# Patient Record
Sex: Female | Born: 1960 | Race: White | Hispanic: No | Marital: Single | Smoking: Never smoker
Health system: Southern US, Community
[De-identification: ages and names within clinical notes are randomized; demographics above are authoritative.]

## PROBLEM LIST (undated history)

## (undated) DIAGNOSIS — G43909 Migraine, unspecified, not intractable, without status migrainosus: Secondary | ICD-10-CM

## (undated) HISTORY — PX: CHOLECYSTECTOMY: SHX55

## (undated) HISTORY — PX: HAND SURGERY: SHX662

## (undated) HISTORY — PX: DILATION AND CURETTAGE OF UTERUS: SHX78

## (undated) HISTORY — PX: KNEE ARTHROSCOPY: SUR90

## (undated) HISTORY — DX: Migraine, unspecified, not intractable, without status migrainosus: G43.909

## (undated) HISTORY — PX: TUBAL LIGATION: SHX77

---

## 2000-12-11 ENCOUNTER — Encounter: Payer: Self-pay | Admitting: Emergency Medicine

## 2000-12-11 ENCOUNTER — Emergency Department (HOSPITAL_COMMUNITY): Admission: EM | Admit: 2000-12-11 | Discharge: 2000-12-12 | Payer: Self-pay | Admitting: Emergency Medicine

## 2003-07-01 ENCOUNTER — Encounter: Payer: Self-pay | Admitting: Preventative Medicine

## 2003-07-01 ENCOUNTER — Ambulatory Visit (HOSPITAL_COMMUNITY): Admission: RE | Admit: 2003-07-01 | Discharge: 2003-07-01 | Payer: Self-pay | Admitting: Preventative Medicine

## 2005-05-04 ENCOUNTER — Ambulatory Visit (HOSPITAL_COMMUNITY): Admission: RE | Admit: 2005-05-04 | Discharge: 2005-05-04 | Payer: Self-pay | Admitting: Neurology

## 2005-07-01 ENCOUNTER — Ambulatory Visit (HOSPITAL_COMMUNITY): Admission: RE | Admit: 2005-07-01 | Discharge: 2005-07-01 | Payer: Self-pay | Admitting: General Surgery

## 2005-07-05 ENCOUNTER — Ambulatory Visit (HOSPITAL_COMMUNITY): Admission: RE | Admit: 2005-07-05 | Discharge: 2005-07-05 | Payer: Self-pay | Admitting: General Surgery

## 2005-10-21 ENCOUNTER — Ambulatory Visit (HOSPITAL_COMMUNITY): Admission: RE | Admit: 2005-10-21 | Discharge: 2005-10-21 | Payer: Self-pay | Admitting: Preventative Medicine

## 2006-12-05 ENCOUNTER — Ambulatory Visit: Payer: Self-pay | Admitting: Family Medicine

## 2006-12-05 DIAGNOSIS — K219 Gastro-esophageal reflux disease without esophagitis: Secondary | ICD-10-CM | POA: Insufficient documentation

## 2006-12-05 DIAGNOSIS — J309 Allergic rhinitis, unspecified: Secondary | ICD-10-CM | POA: Insufficient documentation

## 2006-12-05 DIAGNOSIS — N318 Other neuromuscular dysfunction of bladder: Secondary | ICD-10-CM | POA: Insufficient documentation

## 2006-12-05 DIAGNOSIS — G43009 Migraine without aura, not intractable, without status migrainosus: Secondary | ICD-10-CM | POA: Insufficient documentation

## 2006-12-05 DIAGNOSIS — M129 Arthropathy, unspecified: Secondary | ICD-10-CM | POA: Insufficient documentation

## 2006-12-05 DIAGNOSIS — F411 Generalized anxiety disorder: Secondary | ICD-10-CM | POA: Insufficient documentation

## 2006-12-05 DIAGNOSIS — F329 Major depressive disorder, single episode, unspecified: Secondary | ICD-10-CM | POA: Insufficient documentation

## 2006-12-05 DIAGNOSIS — F17200 Nicotine dependence, unspecified, uncomplicated: Secondary | ICD-10-CM | POA: Insufficient documentation

## 2006-12-05 DIAGNOSIS — J45909 Unspecified asthma, uncomplicated: Secondary | ICD-10-CM | POA: Insufficient documentation

## 2006-12-18 ENCOUNTER — Encounter (INDEPENDENT_AMBULATORY_CARE_PROVIDER_SITE_OTHER): Payer: Self-pay | Admitting: Family Medicine

## 2006-12-19 ENCOUNTER — Ambulatory Visit: Payer: Self-pay | Admitting: Family Medicine

## 2006-12-20 LAB — CONVERTED CEMR LAB
ALT: 16 units/L (ref 0–35)
AST: 20 units/L (ref 0–37)
Albumin: 4.6 g/dL (ref 3.5–5.2)
Alkaline Phosphatase: 70 units/L (ref 39–117)
BUN: 15 mg/dL (ref 6–23)
Basophils Absolute: 0.1 10*3/uL (ref 0.0–0.1)
Basophils Relative: 1 % (ref 0–1)
CO2: 21 meq/L (ref 19–32)
Calcium: 9.8 mg/dL (ref 8.4–10.5)
Chloride: 105 meq/L (ref 96–112)
Creatinine, Ser: 0.68 mg/dL (ref 0.40–1.20)
Eosinophils Absolute: 0.1 10*3/uL (ref 0.0–0.7)
Eosinophils Relative: 1 % (ref 0–5)
Glucose, Bld: 81 mg/dL (ref 70–99)
HCT: 44 % (ref 36.0–46.0)
Hemoglobin: 14.4 g/dL (ref 12.0–15.0)
Lymphocytes Relative: 24 % (ref 12–46)
Lymphs Abs: 2.5 10*3/uL (ref 0.7–3.3)
MCHC: 32.7 g/dL (ref 30.0–36.0)
MCV: 93.2 fL (ref 78.0–100.0)
Monocytes Absolute: 0.8 10*3/uL — ABNORMAL HIGH (ref 0.2–0.7)
Monocytes Relative: 7 % (ref 3–11)
Neutro Abs: 7.1 10*3/uL (ref 1.7–7.7)
Neutrophils Relative %: 68 % (ref 43–77)
Platelets: 281 10*3/uL (ref 150–400)
Potassium: 4.9 meq/L (ref 3.5–5.3)
RBC: 4.72 M/uL (ref 3.87–5.11)
RDW: 12.5 % (ref 11.5–14.0)
Sodium: 141 meq/L (ref 135–145)
TSH: 2.035 microintl units/mL (ref 0.350–5.50)
Total Bilirubin: 0.4 mg/dL (ref 0.3–1.2)
Total Protein: 7.8 g/dL (ref 6.0–8.3)
WBC: 10.6 10*3/uL — ABNORMAL HIGH (ref 4.0–10.5)

## 2007-01-16 ENCOUNTER — Ambulatory Visit: Payer: Self-pay | Admitting: Family Medicine

## 2007-01-16 DIAGNOSIS — G4700 Insomnia, unspecified: Secondary | ICD-10-CM | POA: Insufficient documentation

## 2007-01-30 ENCOUNTER — Ambulatory Visit (HOSPITAL_COMMUNITY): Payer: Self-pay | Admitting: Psychiatry

## 2007-02-05 ENCOUNTER — Ambulatory Visit (HOSPITAL_COMMUNITY): Payer: Self-pay | Admitting: Psychiatry

## 2007-02-16 ENCOUNTER — Ambulatory Visit (HOSPITAL_COMMUNITY): Payer: Self-pay | Admitting: Psychiatry

## 2007-02-21 ENCOUNTER — Ambulatory Visit (HOSPITAL_COMMUNITY): Payer: Self-pay | Admitting: Psychiatry

## 2007-02-27 ENCOUNTER — Ambulatory Visit: Payer: Self-pay | Admitting: Family Medicine

## 2007-03-01 ENCOUNTER — Ambulatory Visit (HOSPITAL_COMMUNITY): Payer: Self-pay | Admitting: Psychiatry

## 2007-03-01 ENCOUNTER — Encounter (INDEPENDENT_AMBULATORY_CARE_PROVIDER_SITE_OTHER): Payer: Self-pay | Admitting: Family Medicine

## 2007-03-06 ENCOUNTER — Ambulatory Visit: Payer: Self-pay | Admitting: Family Medicine

## 2007-03-06 ENCOUNTER — Emergency Department (HOSPITAL_COMMUNITY): Admission: EM | Admit: 2007-03-06 | Discharge: 2007-03-06 | Payer: Self-pay | Admitting: Emergency Medicine

## 2007-03-06 LAB — CONVERTED CEMR LAB
Hemoglobin: 12.2 g/dL
OCCULT 1: NEGATIVE

## 2007-03-07 ENCOUNTER — Ambulatory Visit (HOSPITAL_COMMUNITY): Payer: Self-pay | Admitting: Psychiatry

## 2007-03-07 ENCOUNTER — Telehealth (INDEPENDENT_AMBULATORY_CARE_PROVIDER_SITE_OTHER): Payer: Self-pay | Admitting: *Deleted

## 2007-03-08 ENCOUNTER — Encounter (INDEPENDENT_AMBULATORY_CARE_PROVIDER_SITE_OTHER): Payer: Self-pay | Admitting: Family Medicine

## 2007-03-13 ENCOUNTER — Ambulatory Visit (HOSPITAL_COMMUNITY): Payer: Self-pay | Admitting: Psychiatry

## 2007-03-23 ENCOUNTER — Ambulatory Visit: Payer: Self-pay | Admitting: Family Medicine

## 2007-03-27 ENCOUNTER — Ambulatory Visit: Payer: Self-pay | Admitting: Family Medicine

## 2007-04-30 ENCOUNTER — Encounter (INDEPENDENT_AMBULATORY_CARE_PROVIDER_SITE_OTHER): Payer: Self-pay | Admitting: Family Medicine

## 2007-05-07 ENCOUNTER — Telehealth (INDEPENDENT_AMBULATORY_CARE_PROVIDER_SITE_OTHER): Payer: Self-pay | Admitting: Family Medicine

## 2007-05-07 ENCOUNTER — Telehealth (INDEPENDENT_AMBULATORY_CARE_PROVIDER_SITE_OTHER): Payer: Self-pay | Admitting: *Deleted

## 2007-09-06 ENCOUNTER — Encounter (INDEPENDENT_AMBULATORY_CARE_PROVIDER_SITE_OTHER): Payer: Self-pay | Admitting: Family Medicine

## 2007-10-19 ENCOUNTER — Encounter (INDEPENDENT_AMBULATORY_CARE_PROVIDER_SITE_OTHER): Payer: Self-pay | Admitting: Family Medicine

## 2007-11-12 ENCOUNTER — Ambulatory Visit (HOSPITAL_COMMUNITY): Admission: RE | Admit: 2007-11-12 | Discharge: 2007-11-12 | Payer: Self-pay | Admitting: Family Medicine

## 2007-11-12 ENCOUNTER — Ambulatory Visit: Payer: Self-pay | Admitting: Family Medicine

## 2007-11-12 DIAGNOSIS — M542 Cervicalgia: Secondary | ICD-10-CM | POA: Insufficient documentation

## 2007-11-13 ENCOUNTER — Telehealth (INDEPENDENT_AMBULATORY_CARE_PROVIDER_SITE_OTHER): Payer: Self-pay | Admitting: *Deleted

## 2007-11-13 ENCOUNTER — Ambulatory Visit: Payer: Self-pay | Admitting: Family Medicine

## 2007-11-13 ENCOUNTER — Encounter (INDEPENDENT_AMBULATORY_CARE_PROVIDER_SITE_OTHER): Payer: Self-pay | Admitting: Family Medicine

## 2007-11-13 ENCOUNTER — Other Ambulatory Visit: Admission: RE | Admit: 2007-11-13 | Discharge: 2007-11-13 | Payer: Self-pay | Admitting: Family Medicine

## 2007-11-13 DIAGNOSIS — E785 Hyperlipidemia, unspecified: Secondary | ICD-10-CM | POA: Insufficient documentation

## 2007-11-13 DIAGNOSIS — N6459 Other signs and symptoms in breast: Secondary | ICD-10-CM | POA: Insufficient documentation

## 2007-11-13 LAB — CONVERTED CEMR LAB: Pap Smear: NORMAL

## 2007-11-14 ENCOUNTER — Encounter (INDEPENDENT_AMBULATORY_CARE_PROVIDER_SITE_OTHER): Payer: Self-pay | Admitting: Family Medicine

## 2007-11-14 ENCOUNTER — Telehealth (INDEPENDENT_AMBULATORY_CARE_PROVIDER_SITE_OTHER): Payer: Self-pay | Admitting: *Deleted

## 2007-11-14 LAB — CONVERTED CEMR LAB
FSH: 35.3 milliintl units/mL
LH: 41.7 milliintl units/mL
Prolactin: 9.1 ng/mL

## 2007-11-15 ENCOUNTER — Telehealth (INDEPENDENT_AMBULATORY_CARE_PROVIDER_SITE_OTHER): Payer: Self-pay | Admitting: *Deleted

## 2007-11-15 ENCOUNTER — Encounter (INDEPENDENT_AMBULATORY_CARE_PROVIDER_SITE_OTHER): Payer: Self-pay | Admitting: Family Medicine

## 2007-11-20 ENCOUNTER — Telehealth (INDEPENDENT_AMBULATORY_CARE_PROVIDER_SITE_OTHER): Payer: Self-pay | Admitting: Family Medicine

## 2007-11-21 ENCOUNTER — Ambulatory Visit (HOSPITAL_COMMUNITY): Admission: RE | Admit: 2007-11-21 | Discharge: 2007-11-21 | Payer: Self-pay | Admitting: Family Medicine

## 2007-11-21 ENCOUNTER — Encounter (INDEPENDENT_AMBULATORY_CARE_PROVIDER_SITE_OTHER): Payer: Self-pay | Admitting: Family Medicine

## 2007-11-22 ENCOUNTER — Telehealth (INDEPENDENT_AMBULATORY_CARE_PROVIDER_SITE_OTHER): Payer: Self-pay | Admitting: *Deleted

## 2007-11-23 ENCOUNTER — Telehealth (INDEPENDENT_AMBULATORY_CARE_PROVIDER_SITE_OTHER): Payer: Self-pay | Admitting: *Deleted

## 2007-11-27 ENCOUNTER — Ambulatory Visit: Payer: Self-pay | Admitting: Family Medicine

## 2008-01-29 ENCOUNTER — Ambulatory Visit: Payer: Self-pay | Admitting: Family Medicine

## 2008-09-13 ENCOUNTER — Encounter (INDEPENDENT_AMBULATORY_CARE_PROVIDER_SITE_OTHER): Payer: Self-pay | Admitting: Family Medicine

## 2008-09-17 ENCOUNTER — Ambulatory Visit: Payer: Self-pay | Admitting: Family Medicine

## 2008-09-17 DIAGNOSIS — H8110 Benign paroxysmal vertigo, unspecified ear: Secondary | ICD-10-CM | POA: Insufficient documentation

## 2008-09-18 ENCOUNTER — Telehealth (INDEPENDENT_AMBULATORY_CARE_PROVIDER_SITE_OTHER): Payer: Self-pay | Admitting: Family Medicine

## 2008-12-17 ENCOUNTER — Ambulatory Visit (HOSPITAL_COMMUNITY): Admission: RE | Admit: 2008-12-17 | Discharge: 2008-12-17 | Payer: Self-pay | Admitting: Family Medicine

## 2008-12-17 ENCOUNTER — Ambulatory Visit: Payer: Self-pay | Admitting: Family Medicine

## 2008-12-17 DIAGNOSIS — R079 Chest pain, unspecified: Secondary | ICD-10-CM | POA: Insufficient documentation

## 2009-01-28 ENCOUNTER — Ambulatory Visit (HOSPITAL_COMMUNITY): Admission: RE | Admit: 2009-01-28 | Discharge: 2009-01-28 | Payer: Self-pay | Admitting: Family Medicine

## 2009-01-28 ENCOUNTER — Ambulatory Visit: Payer: Self-pay | Admitting: Family Medicine

## 2009-01-28 DIAGNOSIS — R51 Headache: Secondary | ICD-10-CM | POA: Insufficient documentation

## 2009-01-28 DIAGNOSIS — R519 Headache, unspecified: Secondary | ICD-10-CM | POA: Insufficient documentation

## 2009-01-28 LAB — CONVERTED CEMR LAB
Bilirubin Urine: NEGATIVE
Blood in Urine, dipstick: NEGATIVE
Glucose, Urine, Semiquant: NEGATIVE
Ketones, urine, test strip: NEGATIVE
Nitrite: NEGATIVE
Protein, U semiquant: NEGATIVE
Specific Gravity, Urine: 1.01
Urobilinogen, UA: 0.2
WBC Urine, dipstick: NEGATIVE
pH: 6.5

## 2009-01-29 ENCOUNTER — Encounter (INDEPENDENT_AMBULATORY_CARE_PROVIDER_SITE_OTHER): Payer: Self-pay | Admitting: Family Medicine

## 2009-01-29 LAB — CONVERTED CEMR LAB
ALT: 16 units/L (ref 0–35)
AST: 17 units/L (ref 0–37)
Albumin: 4.4 g/dL (ref 3.5–5.2)
Alkaline Phosphatase: 80 units/L (ref 39–117)
BUN: 13 mg/dL (ref 6–23)
Basophils Absolute: 0 10*3/uL (ref 0.0–0.1)
Basophils Relative: 0 % (ref 0–1)
CO2: 23 meq/L (ref 19–32)
Calcium: 9.7 mg/dL (ref 8.4–10.5)
Chloride: 104 meq/L (ref 96–112)
Cholesterol: 223 mg/dL — ABNORMAL HIGH (ref 0–200)
Creatinine, Ser: 0.79 mg/dL (ref 0.40–1.20)
Eosinophils Absolute: 0.1 10*3/uL (ref 0.0–0.7)
Eosinophils Relative: 1 % (ref 0–5)
Glucose, Bld: 102 mg/dL — ABNORMAL HIGH (ref 70–99)
HCT: 43.4 % (ref 36.0–46.0)
HDL: 67 mg/dL (ref >39)
Hemoglobin: 14.6 g/dL (ref 12.0–15.0)
LDL Cholesterol: 135 mg/dL — ABNORMAL HIGH (ref 0–99)
Lymphocytes Relative: 27 % (ref 12–46)
Lymphs Abs: 2.2 10*3/uL (ref 0.7–4.0)
MCHC: 33.6 g/dL (ref 30.0–36.0)
MCV: 90.8 fL (ref 78.0–100.0)
Monocytes Absolute: 0.7 10*3/uL (ref 0.1–1.0)
Monocytes Relative: 9 % (ref 3–12)
Neutro Abs: 5.2 10*3/uL (ref 1.7–7.7)
Neutrophils Relative %: 63 % (ref 43–77)
Platelets: 231 10*3/uL (ref 150–400)
Potassium: 4.9 meq/L (ref 3.5–5.3)
RBC: 4.78 M/uL (ref 3.87–5.11)
RDW: 13.1 % (ref 11.5–15.5)
Sodium: 140 meq/L (ref 135–145)
TSH: 2.681 microintl units/mL (ref 0.350–4.500)
Total Bilirubin: 0.4 mg/dL (ref 0.3–1.2)
Total CHOL/HDL Ratio: 3.3
Total Protein: 7.7 g/dL (ref 6.0–8.3)
Triglycerides: 107 mg/dL (ref <150)
VLDL: 21 mg/dL (ref 0–40)
WBC: 8.2 10*3/uL (ref 4.0–10.5)

## 2009-02-11 ENCOUNTER — Other Ambulatory Visit: Admission: RE | Admit: 2009-02-11 | Discharge: 2009-02-11 | Payer: Self-pay | Admitting: Family Medicine

## 2009-02-11 ENCOUNTER — Ambulatory Visit: Payer: Self-pay | Admitting: Family Medicine

## 2009-02-11 ENCOUNTER — Encounter (INDEPENDENT_AMBULATORY_CARE_PROVIDER_SITE_OTHER): Payer: Self-pay | Admitting: Family Medicine

## 2009-02-11 DIAGNOSIS — K620 Anal polyp: Secondary | ICD-10-CM | POA: Insufficient documentation

## 2009-02-11 DIAGNOSIS — K621 Rectal polyp: Secondary | ICD-10-CM

## 2009-02-16 ENCOUNTER — Encounter (INDEPENDENT_AMBULATORY_CARE_PROVIDER_SITE_OTHER): Payer: Self-pay | Admitting: *Deleted

## 2009-02-18 ENCOUNTER — Ambulatory Visit (HOSPITAL_COMMUNITY): Admission: RE | Admit: 2009-02-18 | Discharge: 2009-02-18 | Payer: Self-pay | Admitting: Family Medicine

## 2009-02-19 ENCOUNTER — Encounter (INDEPENDENT_AMBULATORY_CARE_PROVIDER_SITE_OTHER): Payer: Self-pay | Admitting: *Deleted

## 2009-02-20 ENCOUNTER — Encounter (INDEPENDENT_AMBULATORY_CARE_PROVIDER_SITE_OTHER): Payer: Self-pay | Admitting: *Deleted

## 2009-03-02 ENCOUNTER — Encounter (INDEPENDENT_AMBULATORY_CARE_PROVIDER_SITE_OTHER): Payer: Self-pay | Admitting: Family Medicine

## 2009-03-02 ENCOUNTER — Ambulatory Visit (HOSPITAL_COMMUNITY): Admission: RE | Admit: 2009-03-02 | Discharge: 2009-03-02 | Payer: Self-pay | Admitting: General Surgery

## 2009-03-11 ENCOUNTER — Ambulatory Visit: Payer: Self-pay | Admitting: Family Medicine

## 2010-11-14 LAB — CONVERTED CEMR LAB
ALT: 19 units/L (ref 0–35)
AST: 23 units/L (ref 0–37)
Albumin: 4.5 g/dL (ref 3.5–5.2)
Alkaline Phosphatase: 64 units/L (ref 39–117)
BUN: 13 mg/dL (ref 6–23)
Basophils Absolute: 0.1 10*3/uL (ref 0.0–0.1)
Basophils Relative: 1 % (ref 0–1)
Bilirubin Urine: NEGATIVE
CO2: 23 meq/L (ref 19–32)
Calcium: 9.6 mg/dL (ref 8.4–10.5)
Chloride: 104 meq/L (ref 96–112)
Cholesterol, target level: 200 mg/dL
Cholesterol: 230 mg/dL — ABNORMAL HIGH (ref 0–200)
Creatinine, Ser: 0.79 mg/dL (ref 0.40–1.20)
Eosinophils Absolute: 0.1 10*3/uL (ref 0.0–0.7)
Eosinophils Relative: 2 % (ref 0–5)
Glucose, Bld: 111 mg/dL — ABNORMAL HIGH (ref 70–99)
Glucose, Urine, Semiquant: NEGATIVE
HCT: 45.3 % (ref 36.0–46.0)
HDL goal, serum: 40 mg/dL
HDL: 69 mg/dL (ref >39)
Hemoglobin: 14.9 g/dL (ref 12.0–15.0)
LDL Cholesterol: 135 mg/dL — ABNORMAL HIGH (ref 0–99)
LDL Goal: 160 mg/dL
Lymphocytes Relative: 29 % (ref 12–46)
Lymphs Abs: 2.4 10*3/uL (ref 0.7–4.0)
MCHC: 32.9 g/dL (ref 30.0–36.0)
MCV: 91.7 fL (ref 78.0–100.0)
Monocytes Absolute: 0.6 10*3/uL (ref 0.1–1.0)
Monocytes Relative: 7 % (ref 3–12)
Neutro Abs: 5.2 10*3/uL (ref 1.7–7.7)
Neutrophils Relative %: 62 % (ref 43–77)
Nitrite: NEGATIVE
Platelets: 232 10*3/uL (ref 150–400)
Potassium: 5 meq/L (ref 3.5–5.3)
RBC: 4.94 M/uL (ref 3.87–5.11)
RDW: 13 % (ref 11.5–15.5)
Sodium: 139 meq/L (ref 135–145)
Specific Gravity, Urine: 1.02
TSH: 3.349 microintl units/mL (ref 0.350–5.50)
Total Bilirubin: 0.5 mg/dL (ref 0.3–1.2)
Total CHOL/HDL Ratio: 3.3
Total Protein: 7.8 g/dL (ref 6.0–8.3)
Triglycerides: 128 mg/dL (ref <150)
Urobilinogen, UA: 0.2
VLDL: 26 mg/dL (ref 0–40)
WBC Urine, dipstick: NEGATIVE
WBC: 8.4 10*3/uL (ref 4.0–10.5)
pH: 5.5

## 2010-11-16 NOTE — Medication Information (Signed)
Summary: Tax adviser   Imported By: Donneta Romberg 09/06/2007 11:35:23  _____________________________________________________________________  External Attachment:    Type:   Image     Comment:   External Document
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
## 2010-11-16 NOTE — Assessment & Plan Note (Signed)
Summary: Rash/arc   Vital Signs:  Patient Profile:   50 Years Old Female Height:     67 inches Weight:      154 pounds BMI:     24.21 O2 Sat:      100 % Pulse rate:   93 / minute Resp:     14 per minute BP sitting:   124 / 75  Vitals Entered By: Sherilyn Banker (January 29, 2008 9:57 AM)                 PCP:  Franchot Heidelberg, MD  Chief Complaint:  rash on abdomen and legs  x 2 weeks / needs med refills.  History of Present Illness: Pt in for acute visit.  She has had a rash for two weeks now. States very itchy. Started under left breast area and has spread to flannks. She self treated with Benadryl and hand sanitizer and nothing helps.  She has tried topical hydrocrtisone and this made it quite a bit better. She states she has not had fever, chills or sweats. Admits feels hot though. Sx worse at night with regard to itch. She has not been in woods. Denies new pets and adds had pitbull come uop in yard one week ago. She has not used new meds. She has not used new body products but adds was down in [place] 3 weeks ago and spent time with son and rash started a week or so after.   She now presents.    Prior Medications Reviewed Using: Medication Bottles  Updated Prior Medication List: TRAMADOL HCL 50 MG TABS (TRAMADOL HCL) 1-2 tabs q8hrs IMITREX 50 MG TABS (SUMATRIPTAN SUCCINATE) 1 tab q2hrs as needed MECLIZINE HCL 25 MG TABS (MECLIZINE HCL) q8hrs INDERAL LA 80 MG CP24 (PROPRANOLOL HCL) once daily DETROL LA 4 MG CP24 (TOLTERODINE TARTRATE) once daily PROAIR HFA  AERS (ALBUTEROL SULFATE AERS) 2 puffs every 6 hours as needed. VITAMIN B-12 100 MCG TABS (CYANOCOBALAMIN) One by mouth daily ZOLOFT 100 MG TABS (SERTRALINE HCL) 1/2 by mouth daily SINGULAIR 10 MG TABS (MONTELUKAST SODIUM) One daily HYDROXYZINE HCL 50 MG TABS (HYDROXYZINE HCL) One by mouth every 6 hours as needed. PRILOSEC 20 MG CPDR (OMEPRAZOLE) One daily FLEXERIL 10 MG  TABS (CYCLOBENZAPRINE HCL) three times a  day  Current Allergies (reviewed today): ! PENICILLIN ! CEPHALOSPORINS ! * BETALACTAMS ! * CARBAPENEM ! * PENICILLAMINE ! * SALICYLATES ! * PYRAZOLE ANTIPYRECTICS ! NSAIDS ! * SULFINPYRAZONE ! * OPIOD ANALGESICS ! MORPHINE ! CODEINE  Past Medical History:    Reviewed history from 12/05/2006 and no changes required:       Current Problems:        HYPERLIPIDEMIA (ICD-272.4)       NIPPLE DISCHARGE (ICD-611.79)       SPECIAL SCREENING FOR OSTEOPOROSIS (ICD-V82.81)       SPECIAL SCREENING FOR MALIGNANT NEOPLASMS COLON (ICD-V76.51)       OTHER SCREENING BREAST EXAMINATION (ICD-V76.19)       NECK PAIN (ICD-723.1)       WELL ADULT EXAM (ICD-V70.0)       BACK PAIN, ACUTE (ICD-724.5)       CHEST PAIN (ICD-786.50)       ASTHMA (ICD-493.90)       INSOMNIA (ICD-780.52)       TOBACCO USER (ICD-305.1)       ARTHRITIS (ICD-716.90)       COMMON MIGRAINE (ICD-346.10)       OVERACTIVE BLADDER (ICD-596.51)  ASTHMA (ICD-493.90)       GERD (ICD-530.81)       DEPRESSION (ICD-311)       ANXIETY (ICD-300.00)       ALLERGIC RHINITIS (ICD-477.9)         Past Surgical History:    Reviewed history from 12/05/2006 and no changes required:       Cholecystectomy       Tubal ligation       prolapsed colon       breast cyst       R hand       L leg       4 D & C   Family History:    Reviewed history from 12/05/2006 and no changes required:       father- stomach ca/ htn/ dm/ chf       mother-colon ca/ allergies       grandmother- kidney failure       3 brothers & 1 sister-htn/ dm       2 children- allergies  Social History:    Reviewed history from 12/05/2006 and no changes required:       Married       lives with spouse and children       Current Smoker       Alcohol use-yes       Drug use-no   Risk Factors:  Tobacco use:  current    Year started:  2003    Cigarettes:  Yes -- 1/2 pack(s) per day    Counseled to quit/cut down tobacco use:  yes Drug use:  no Alcohol use:   yes    Type:  Shots of Vodka    Drinks per day:  3  Family History Risk Factors:    Family History of MI in females < 31 years old:  no    Family History of MI in males < 57 years old:  no  Mammogram History:    Date of Last Mammogram:  11/21/2007  PAP Smear History:    Date of Last PAP Smear:  11/13/2007   Review of Systems      See HPI   Physical Exam  General:     Well-developed,well-nourished,in no acute distress; alert,appropriate and cooperative throughout examination Lungs:     Normal respiratory effort, chest expands symmetrically. Lungs are clear to auscultation, no crackles or wheezes. Heart:     Normal rate and regular rhythm. S1 and S2 normal without gallop, murmur, click, rub or other extra sounds. Abdomen:     Bowel sounds positive,abdomen soft and non-tender without masses, organomegaly or hernias noted. Extremities:     No clubbing, cyanosis, edema, or deformity noted with normal full range of motion of all joints.   Skin:     Smal;l red viscular rash left and right flank. Lesions few mm in size, slightly raised. No dranage or cellulitus. Psych:     Cognition and judgment appear intact. Alert and cooperative with normal attention span and concentration. No apparent delusions, illusions, hallucinations    Impression & Recommendations:  Problem # 1:  SKIN RASH (ICD-782.1) Discussed. Single dose Decadron/Depomedrol. Apply topical Beandryl cream. Councelled need to do oat meal baths and will rfill hydrozxyzine for itch. Update if sx perist and refer Derm. Orders: Decadron 1mg  (Z3664) Depo- Medrol 80mg  (J1040) Admin of Therapeutic Inj  intramuscular or subcutaneous (40347)   Problem # 2:  TOBACCO USER (ICD-305.1) Could not afford Chantix. Not open to Rx at present.  Follow. Aware of risk and benefit. The following medications were removed from the medication list:    Chantix 0.5 Mg Tabs (Varenicline tartrate) ..... One daily   Problem # 3:   Dispostion Pt has to go down to [place] to care for grandkids next 6 months while son off to [place]. Scripts refilled for 3 months and advised to call if more needed. Will tentatively plan to see back in 3 months unless absolutely unable to do so.  Complete Medication List: 1)  Tramadol Hcl 50 Mg Tabs (Tramadol hcl) .Marland Kitchen.. 1-2 tabs q8hrs 2)  Imitrex 50 Mg Tabs (Sumatriptan succinate) .Marland Kitchen.. 1 tab q2hrs as needed 3)  Meclizine Hcl 25 Mg Tabs (Meclizine hcl) .... Q8hrs 4)  Inderal La 80 Mg Cp24 (Propranolol hcl) .... Once daily 5)  Detrol La 4 Mg Cp24 (Tolterodine tartrate) .... Once daily 6)  Proair Hfa Aers (Albuterol sulfate aers) .... 2 puffs every 6 hours as needed. 7)  Vitamin B-12 100 Mcg Tabs (Cyanocobalamin) .... One by mouth daily 8)  Zoloft 100 Mg Tabs (Sertraline hcl) .... 1/2 by mouth daily 9)  Singulair 10 Mg Tabs (Montelukast sodium) .... One daily 10)  Hydroxyzine Hcl 50 Mg Tabs (Hydroxyzine hcl) .... One by mouth every 6 hours as needed. 11)  Prilosec 20 Mg Cpdr (Omeprazole) .... One daily 12)  Flexeril 10 Mg Tabs (Cyclobenzaprine hcl) .... Three times a day   Patient Instructions: 1)  Please schedule a follow-up appointment in 3 months.    Prescriptions: PRILOSEC 20 MG CPDR (OMEPRAZOLE) One daily  #30 x 3   Entered and Authorized by:   Franchot Heidelberg MD   Signed by:   Franchot Heidelberg MD on 01/29/2008   Method used:   Print then Give to Patient   RxID:   [id number] HYDROXYZINE HCL 50 MG TABS (HYDROXYZINE HCL) One by mouth every 6 hours as needed.  #60 x 0   Entered and Authorized by:   Franchot Heidelberg MD   Signed by:   Franchot Heidelberg MD on 01/29/2008   Method used:   Print then Give to Patient   RxID:   [id number] SINGULAIR 10 MG TABS (MONTELUKAST SODIUM) One daily  #30 x 3   Entered and Authorized by:   Franchot Heidelberg MD   Signed by:   Franchot Heidelberg MD on 01/29/2008   Method used:   Print then Give to Patient   RxID:    [id number] ZOLOFT 100 MG TABS (SERTRALINE HCL) 1/2 by mouth daily  #15 x 3   Entered and Authorized by:   Franchot Heidelberg MD   Signed by:   Franchot Heidelberg MD on 01/29/2008   Method used:   Print then Give to Patient   RxID:   [id number] PROAIR HFA  AERS (ALBUTEROL SULFATE AERS) 2 puffs every 6 hours as needed.  #1 x 3   Entered and Authorized by:   Franchot Heidelberg MD   Signed by:   Franchot Heidelberg MD on 01/29/2008   Method used:   Print then Give to Patient   RxID:   [id number] DETROL LA 4 MG CP24 (TOLTERODINE TARTRATE) once daily  #30 x 3   Entered and Authorized by:   Franchot Heidelberg MD   Signed by:   Franchot Heidelberg MD on 01/29/2008   Method used:   Print then Give to Patient   RxID:   [id number] INDERAL LA 80 MG CP24 (PROPRANOLOL HCL) once daily  #30 x 3   Entered and  Authorized by:   Franchot Heidelberg MD   Signed by:   Franchot Heidelberg MD on 01/29/2008   Method used:   Print then Give to Patient   RxID:   [id number] MECLIZINE HCL 25 MG TABS (MECLIZINE HCL) q8hrs  #30 x 3   Entered and Authorized by:   Franchot Heidelberg MD   Signed by:   Franchot Heidelberg MD on 01/29/2008   Method used:   Print then Give to Patient   RxID:   [id number]  ]  Medication Administration  Injection # 1:    Medication: Decadron 1mg     Diagnosis: SKIN RASH (ICD-782.1)    Route: IM    Site: L thigh    Exp Date: 10/17/2009    Lot #: [id number]    Mfr: [employer]    Patient tolerated injection without complications    Given by: Sherilyn Banker (January 29, 2008 11:05 AM)  Injection # 2:    Medication: Depo- Medrol 80mg     Diagnosis: SKIN RASH (ICD-782.1)    Route: IM    Site: L thigh    Exp Date: 02/14/2009    Mfr: sicor    Patient tolerated injection without complications    Given by: Sherilyn Banker (January 29, 2008 11:06 AM)  Orders Added: 1)  Est. Patient Level III [84696] 2)  Decadron 1mg  [J1094] 3)  Depo- Medrol 80mg  [J1040] 4)   Admin of Therapeutic Inj  intramuscular or subcutaneous [29528]

## 2010-11-16 NOTE — Assessment & Plan Note (Signed)
Summary: 1 month follow up/arc   Vital Signs:  Patient Profile:   50 Years Old Female Height:     67 inches Weight:      150 pounds BMI:     23.58 O2 Sat:      100 % Temp:     98.1 degrees F Pulse rate:   100 / minute Resp:     16 per minute BP sitting:   135 / 66  Vitals Entered By: Sherilyn Banker (January 16, 2007 3:16 PM)               Visit Type:  Recheck PCP:  Franchot Heidelberg, MD  Chief Complaint:  follow up visit.  History of Present Illness: Pt in today for follow-up.  She notes she is doing fair. She is on Zoloft for depression. She states this works well and she feels a little better. She has a lot of stress in caring for her mother-in-law and she notes her husband and her are trying to work out things given his cheating and sleeping around.   She is still drinking and states weekends are worse. She drank four drinks off double shots on Friday and did the same on Sunday. She was dry the rest of the week. She states she drinks beacause she needs to chill. She wants Xanax. She was on this in the past. She has been referred to Psych but did not go as she did not want to fill out her paperwork to see.  She is less irritable than before. She is concentrating so-so. Her apetite is fair. She states support at home is aweful but husband and "nana" are more supportive. No suicdal ideation.   She just bwants something to help her sleep.  Now presents.   Prior Medications (reviewed today): TRAMADOL HCL 50 MG TABS (TRAMADOL HCL) 1-2 tabs q8hrs IMITREX 50 MG TABS (SUMATRIPTAN SUCCINATE) 1 tab q2hrs as needed MECLIZINE HCL 25 MG TABS (MECLIZINE HCL) q8hrs INDERAL LA 80 MG CP24 (PROPRANOLOL HCL) once daily DETROL LA 4 MG CP24 (TOLTERODINE TARTRATE) once daily PROAIR HFA  AERS (ALBUTEROL SULFATE AERS) 2 puffs every 6 hours as needed. VITAMIN B-12 100 MCG TABS (CYANOCOBALAMIN) One by mouth daily ZOLOFT 100 MG TABS (SERTRALINE HCL) One by mouth daily PROMETHAZINE HCL 25 MG TABS  (PROMETHAZINE HCL) One by mouth every 6 hours as needed. Current Allergies (reviewed today): No known allergies      Review of Systems      See HPI   Physical Exam  General:     Well-developed,well-nourished,in no acute distress; alert,appropriate and cooperative throughout examination Lungs:     Normal respiratory effort, chest expands symmetrically. Lungs are clear to auscultation, no crackles or wheezes. Heart:     Normal rate and regular rhythm. S1 and S2 normal without gallop, murmur, click, rub or other extra sounds. Abdomen:     Bowel sounds positive,abdomen soft and non-tender without masses, organomegaly or hernias noted. Extremities:     No clubbing, cyanosis, edema, or deformity noted with normal full range of motion of all joints.   Psych:     Oriented X3, flat affect, and slightly anxious.      Impression & Recommendations:  Problem # 1:  ETHANOL ABUSE (ICD-305.00) Councelled again on need to avoid. Outlined risk associated with this. Pt well aware. Wants Xanax. Advised that this increases risk for addiction and it is imperative she Psych for eval. She is agreeable with latter. Await input.  Problem #  2:  TOBACCO USER (ICD-305.1) Councelled on cessation - not open to this at present. Discussed risk and benefit of smoking including cancer and death as well as treatment options and smoking cessation classes.  Problem # 3:  DEPRESSION (ICD-311) Improved though multiple social stressors. Advised need tosee Psych. Pt agrees.  Has been set up appt in past and notes she was told with last cancel she should just call them - advised we will be happy to set this up - she notes she is an adult and will do it herself. [facility] placed to [facility] and patient needs rerefferal - nurse to assit. Recheck 6 weeks. Treat sx of insomnia with trial Rozerem - outlined if Psych deems use of Benzo appropriate, they will prescribe. Suspect substance abuse including narcotics in patient as she  hammered on wanting Xanax during her visit. Await Psych input. Her updated medication list for this problem includes:    Zoloft 100 Mg Tabs (Sertraline hcl) ..... One by mouth daily   Problem # 4:  INSOMNIA (ICD-780.52) See above. Outlined need for proper sleep hygiene. Her updated medication list for this problem includes:    Rozerem 8 Mg Tabs (Ramelteon) ..... One 30 min to 1 hour before sleep   Medications Added to Medication List This Visit: 1)  Rozerem 8 Mg Tabs (Ramelteon) .... One 30 min to 1 hour before sleep   Patient Instructions: 1)  Please schedule a follow-up appointment in 6 weeks -sooner if needed.  Appended Document: 1 month follow up/arc pt will call ruby at behavioral health and set up appt.

## 2010-11-16 NOTE — Assessment & Plan Note (Signed)
Summary: 4 WEEK FOLLOW UP/ARC   Vital Signs:  Patient profile:   50 year old female Height:      67 inches Weight:      154 pounds BMI:     24.21 O2 Sat:      98 % Temp:     97.9 degrees F Pulse rate:   92 / minute Resp:     14 per minute BP sitting:   140 / 77  Vitals Entered By: Sherilyn Banker LPN (Mar 11, 2009 10:02 AM)  Primary Provider:  Franchot Heidelberg, MD  CC:  CT result.  History of Present Illness: Pt in for recheck.  She had recent well woman exam. Mammo and pap was normal and she is ecstatic with this.  She saw Dr. Lovell Sheehan for her colonoscopy and had this last Monday. She states he did not tell her much and barely saw him. She states was not the best experience as she was very restless with exam. She states her apetite is good. She denies nausea and vomitting. Denies diarrhea and constipation. No bloody stools. Report in EMR shows sesile polyp. Repeat scope in 3 years advised.  She also had CT chest related to pulmonary nodules. This is reviewed:  1.  Multiple pulmonary nodules are present in the 3-4 mm range as noted above.  These nodules do not appear calcified.  Although statistically highly likely to be benign, if the patient is at high isk for lung cancer (for example, a tobacco user), then follow-up CT in 12 months time would be recommended to assess these nodules for stability.   She is a smoker. She has done this for years and is not open to cessation. She states Chantix was too expensive. She smokes half pack per day. She states does not do soda and eats healthy and states not ready to quit this.   She now presents.   Current Problems (verified): 1)  Rectal Polyps  (ICD-569.0) 2)  Well Adult Exam  (ICD-V70.0) 3)  Pulmonary Nodule, Right Upper Lobe  (ICD-518.89) 4)  Headache  (ICD-784.0) 5)  Chest Pain, Right  (ICD-786.50) 6)  Benign Positional Vertigo  (ICD-386.11) 7)  Hyperlipidemia  (ICD-272.4) 8)  Nipple Discharge  (ICD-611.79) 9)  Neck Pain   (ICD-723.1) 10)  Asthma  (ICD-493.90) 11)  Insomnia  (ICD-780.52) 12)  Tobacco User  (ICD-305.1) 13)  Arthritis  (ICD-716.90) 14)  Common Migraine  (ICD-346.10) 15)  Overactive Bladder  (ICD-596.51) 16)  Asthma  (ICD-493.90) 17)  Gerd  (ICD-530.81) 18)  Depression  (ICD-311) 19)  Anxiety  (ICD-300.00) 20)  Allergic Rhinitis  (ICD-477.9)  Current Medications (verified): 1)  Imitrex 50 Mg Tabs (Sumatriptan Succinate) .Marland Kitchen.. 1 Tab Q2hrs As Needed But No More Than Two in 24 Hours 2)  Meclizine Hcl 25 Mg Tabs (Meclizine Hcl) .... One Every 8 Hours As Needed For Nausea With Travel 3)  Inderal La 80 Mg Cp24 (Propranolol Hcl) .... Once Daily 4)  Detrol La 4 Mg Cp24 (Tolterodine Tartrate) .... Once Daily 5)  Proair Hfa  Aers (Albuterol Sulfate Aers) .... 2 Puffs Every 6 Hours As Needed. 6)  Vitamin B-12 100 Mcg Tabs (Cyanocobalamin) .... One By Mouth Daily 7)  Singulair 10 Mg Tabs (Montelukast Sodium) .... One Daily 8)  Prilosec 20 Mg Cpdr (Omeprazole) .... One Daily 9)  Promethazine Hcl 25 Mg Tabs (Promethazine Hcl) .... Every 6 Hrs As Needed 10)  Ultram 50 Mg Tabs (Tramadol Hcl) .... One Every 6 Hours  Allergies (verified):  1)  ! Penicillin 2)  ! Cephalosporins 3)  ! * Betalactams 4)  ! * Carbapenem 5)  ! * Penicillamine 6)  ! * Salicylates 7)  ! * Pyrazole Antipyrectics 8)  ! Nsaids 9)  ! * Sulfinpyrazone 10)  ! * Opiod Analgesics 11)  ! Morphine 12)  ! Codeine  Past History:  Past Medical History: Last updated: 01/29/2008 Current Problems:  HYPERLIPIDEMIA (ICD-272.4) NIPPLE DISCHARGE (ICD-611.79) SPECIAL SCREENING FOR OSTEOPOROSIS (ICD-V82.81) SPECIAL SCREENING FOR MALIGNANT NEOPLASMS COLON (ICD-V76.51) OTHER SCREENING BREAST EXAMINATION (ICD-V76.19) NECK PAIN (ICD-723.1) WELL ADULT EXAM (ICD-V70.0) BACK PAIN, ACUTE (ICD-724.5) CHEST PAIN (ICD-786.50) ASTHMA (ICD-493.90) INSOMNIA (ICD-780.52) TOBACCO USER (ICD-305.1) ARTHRITIS (ICD-716.90) COMMON MIGRAINE  (ICD-346.10) OVERACTIVE BLADDER (ICD-596.51) ASTHMA (ICD-493.90) GERD (ICD-530.81) DEPRESSION (ICD-311) ANXIETY (ICD-300.00) ALLERGIC RHINITIS (ICD-477.9)  Past Surgical History: Last updated: 01/28/2009 1. Cholecystectomy 2. Tubal ligation 3. colonic polyps with scope  4. breast cyst - left 5. R hand middle finger tendon repair due to glass injury with baby bottle 6. L leg - knee arthroscopy 7. 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 Lungs are clear to auscultation, no crackles or wheezes. Heart:  Normal rate and regular rhythm. S1 and S2 normal without gallop, murmur, click, rub or other extra sounds. Abdomen:  Bowel sounds positive,abdomen soft and non-tender without masses, organomegaly or hernias noted. Extremities:  No clubbing, cyanosis, edema, or deformity noted with normal full range of motion of all joints.   Cervical Nodes:  No lymphadenopathy noted Psych:  Cognition and judgment appear intact. Alert and cooperative with normal attention span and concentration. No apparent delusions, illusions, hallucinations   Impression & Recommendations:  Problem # 1:  RECTAL POLYPS (ICD-569.0) Discussed.  Uncerain of waht transpired at hospital. Advised to contact Dr. Lovell Sheehan to get clarification.Scope report reviewed. Receck TCS in 3 years.  Problem # 2:  PULMONARY NODULE - REPEAT  CT MAY 2011 (ICD-518.89) Reviewed. Likly benign. Repeat CT in 12 months. Monitor for weight loss, cough etc. Quit smoking. See below.  Problem # 3:  TOBACCO USER (ICD-305.1) Advised risk and benefit and councelled Rx options. Refuses. Councelled cancer and death risk.  Complete Medication List: 1)  Imitrex 50 Mg Tabs (Sumatriptan succinate) .Marland Kitchen.. 1 tab q2hrs as needed but no more than two in 24 hours 2)  Meclizine Hcl 25 Mg Tabs (Meclizine hcl) .... One every 8 hours as needed for nausea with travel 3)  Inderal La 80 Mg Cp24 (Propranolol hcl) .... Once daily 4)  Detrol La 4 Mg Cp24 (Tolterodine tartrate) .... Once daily 5)  Proair Hfa Aers (Albuterol sulfate aers) .... 2 puffs every 6 hours as needed. 6)  Vitamin B-12 100 Mcg Tabs (Cyanocobalamin) .... One by mouth daily 7)  Singulair 10 Mg Tabs (  Montelukast sodium) .... One daily 8)  Prilosec 20 Mg Cpdr (Omeprazole) .... One daily 9)  Promethazine Hcl 25 Mg Tabs (Promethazine hcl) .... Every 6 hrs as needed 10)  Ultram 50 Mg Tabs (Tramadol hcl) .... One every 6 hours  Patient Instructions: 1)  Please schedule a follow-up appointment in 6 months.   Preventive Care Screening  Mammogram:    Next Due:  02/2010  Pap Smear:    Next Due:  02/2010  Colonoscopy:    Date:  03/02/2009    Next Due:  03/2012    Results:  Rectal polyp

## 2010-11-16 NOTE — Letter (Signed)
Summary: H&P  H&P   Imported By: Magdalene River 12/18/2006 15:11:25  _____________________________________________________________________  External Attachment:    Type:   Image     Comment:   External Document

## 2010-11-16 NOTE — Medication Information (Signed)
Summary: Tax adviser   Imported By: Donneta Romberg 09/06/2007 11:35:42  _____________________________________________________________________  External Attachment:    Type:   Image     Comment:   External Document

## 2010-11-16 NOTE — Letter (Signed)
Summary: AUTHORIZATION FOR DISCLOSURE OF PROTECTED HEALTH INFORMATION  AUTHORIZATION FOR DISCLOSURE OF PROTECTED HEALTH INFORMATION   Imported By: Magdalene River 03/01/2007 08:20:17  _____________________________________________________________________  External Attachment:    Type:   Image     Comment:   External Document

## 2010-11-16 NOTE — Procedures (Signed)
Summary: colonoscopy   colonoscopy   Imported By: Curtis Sites 03/03/2009 14:53:49  _____________________________________________________________________  External Attachment:    Type:   Image     Comment:   External Document

## 2010-11-16 NOTE — Letter (Signed)
Summary: PREVIOUS NOTES  PREVIOUS NOTES   Imported By: Magdalene River 12/05/2006 16:03:19  _____________________________________________________________________  External Attachment:    Type:   Image     Comment:   External Document

## 2010-11-16 NOTE — Progress Notes (Signed)
Summary: Pt wants her Valium  Phone Note Call from Patient   Summary of Call: Pt called saying that her phenergan suppositories were not working. It took a long time to understand that this is exactly what she wanted because she was slurring and mixing up her words alot. She wanted to take the Diazapam but Dr Erby Pian told her not to take this. She was upset because Dr Erby Pian did not want her to take the Diazapam and was told to make appt if she did not agree w/ this but she said that she would call back. Initial call taken by: Sherilyn Banker,  September 18, 2008 11:48 AM  Follow-up for Phone Call        Noted. Follow-up by: Franchot Heidelberg MD,  September 18, 2008 11:56 AM

## 2010-11-16 NOTE — Progress Notes (Signed)
Summary: 11/13/07 path results  Phone Note Outgoing Call   Call placed by: Sonny Dandy,  November 15, 2007 3:24 PM Summary of Call: Results given to patient, voices understanding   Initial call taken by: Sonny Dandy,  November 15, 2007 3:24 PM

## 2010-11-16 NOTE — Progress Notes (Signed)
Summary: Anxious  Phone Note Call from Patient   Caller: Patient Summary of Call: called and wanted something called in for nerves. also c/o itching and that pt(Savannah Trevino) she was taking care had shingles and thinks she may have caught this... advise Initial call taken by: Sonny Dandy,  May 07, 2007 1:33 PM  Follow-up for Phone Call        May refill Hydroxyzine for one month. If sx worsen or truly suspect shingles needs work-in tomorrow. Nurse to assist. Follow-up by: Franchot Heidelberg MD,  May 07, 2007 1:49 PM
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
## 2010-11-16 NOTE — Progress Notes (Signed)
Summary: med refill  Phone Note Outgoing Call   Call placed by: Sonny Dandy,  May 07, 2007 3:08 PM Summary of Call: call to patient, advised of med refill called to [facility]. Pt is going to [place] and will be back thursday or friday and she will call and let us know how she is Initial call taken by: Sonny Dandy,  May 07, 2007 3:09 PM

## 2010-11-16 NOTE — Progress Notes (Signed)
Summary: Lab results - reviewed by MD

## 2010-11-16 NOTE — Assessment & Plan Note (Signed)
Summary: IN PAIN/FRONT SIDE THIS TIME INSTEAD OF BACK/SLJ   Vital Signs:  Patient profile:   50 year old female Height:      67 inches Weight:      162 pounds O2 Sat:      100 % Pulse rate:   93 / minute Resp:     14 per minute BP sitting:   134 / 73  Vitals Entered By: Sherilyn Banker LPN (January 28, 2009 9:55 AM) CC: Pain on R side, and  migraines from fall 6 weeks ago / needs refills, Lipid Management   Primary Provider:  Franchot Heidelberg, MD  CC:  Pain on R side, and  migraines from fall 6 weeks ago / needs refills, and Lipid Management.  History of Present Illness: Pt in for discussion of rib pain.   She sustained rib fractures about 6 weeks ago. She had xrays and this is reviewed:  March 2010 -   No pleural effusion or pulmonary edema is noted. Acute fracture deformities involve the right eighth and ninth ribs.   She states she did the Ultram and deep breathing exersizes and it helped. She states she has pain now over her RUQ under her breast. States same area as fractures. SHe states the pain is a cnstant hut - worse with pressure and deep breaths. She rates pain as catching sensation and states dull to sharp. States 4/10 without pressure. 9 with pressure. She denies cough and wheeze. States some allergies though and using Singulair and it helps. Add sneeze exacerbates pain. She has a good apetite. Denies nausea and vomitting. Denies diarrhea and has some constipation. Using OTC stool softner and it helps. She states no bloody stools. Has self treated with Aleve and Ibuprofen.  She states since fall has had some headcahec to. States back of neck where mucle anchors to skull on left. She states pain since fall. She states pain here is dull ache. Hurts to wear necklace. Gives headaches. Radiates to front. States rates pain here as 10/10. Worse with light and sound. Better with sark room and sleep. She states terrbile headaches and has had vomititng with this. She has a hx of  migraines and used Imitrex in the past with some phenergan. She has used this last week and it helped. She states has not had bad headache since then. She states no weakness in arms or legs. She denies blackouts.  She denies parasthesia.  She now presents.  Lipid Management History:      Positive NCEP/ATP III risk factors include current tobacco user.  Negative NCEP/ATP III risk factors include female age less than 27 years old, non-diabetic, HDL cholesterol greater than 60, no family history for ischemic heart disease, non-hypertensive, no ASHD (atherosclerotic heart disease), no prior stroke/TIA, no peripheral vascular disease, and no history of aortic aneurysm.        The patient states that she does not know about the "Therapeutic Lifestyle Change" diet.  Her compliance with the TLC diet is fair.  The patient expresses understanding of adjunctive measures for cholesterol lowering.  Comments: Agrees with lab check.  Preventive Screening-Counseling & Management     Alcohol drinks/day: 3     Alcohol type: Shots of Vodka occasionally     Smoking Status: current     Smoking Cessation Counseling: yes     Smoke Cessation Stage: precontemplative     Packs/Day: 1/2     Year Started: 2003  Current Problems (verified): 1)  Chest Pain, Right  (  ICD-786.50) 2)  Benign Positional Vertigo  (ICD-386.11) 3)  Hyperlipidemia  (ICD-272.4) 4)  Nipple Discharge  (ICD-611.79) 5)  Neck Pain  (ICD-723.1) 6)  Asthma  (ICD-493.90) 7)  Insomnia  (ICD-780.52) 8)  Tobacco User  (ICD-305.1) 9)  Arthritis  (ICD-716.90) 10)  Common Migraine  (ICD-346.10) 11)  Overactive Bladder  (ICD-596.51) 12)  Asthma  (ICD-493.90) 13)  Gerd  (ICD-530.81) 14)  Depression  (ICD-311) 15)  Anxiety  (ICD-300.00) 16)  Allergic Rhinitis  (ICD-477.9)  Current Medications (verified): 1)  Imitrex 50 Mg Tabs (Sumatriptan Succinate) .Marland Kitchen.. 1 Tab Q2hrs As Needed But No More Than Two in 24 Hours 2)  Meclizine Hcl 25 Mg Tabs (Meclizine  Hcl) .... One Every 8 Hours As Needed For Nausea With Travel 3)  Inderal La 80 Mg Cp24 (Propranolol Hcl) .... Once Daily 4)  Detrol La 4 Mg Cp24 (Tolterodine Tartrate) .... Once Daily 5)  Proair Hfa  Aers (Albuterol Sulfate Aers) .... 2 Puffs Every 6 Hours As Needed. 6)  Vitamin B-12 100 Mcg Tabs (Cyanocobalamin) .... One By Mouth Daily 7)  Singulair 10 Mg Tabs (Montelukast Sodium) .... One Daily 8)  Prilosec 20 Mg Cpdr (Omeprazole) .... One Daily 9)  Promethazine Hcl 25 Mg Tabs (Promethazine Hcl) .... Every 6 Hrs As Needed 10)  Ultram 50 Mg Tabs (Tramadol Hcl) .... One Every 6 Hours  Allergies (verified): 1)  ! Penicillin 2)  ! Cephalosporins 3)  ! * Betalactams 4)  ! * Carbapenem 5)  ! * Penicillamine 6)  ! * Salicylates 7)  ! * Pyrazole Antipyrectics 8)  ! Nsaids 9)  ! * Sulfinpyrazone 10)  ! * Opiod Analgesics 11)  ! Morphine 12)  ! Codeine  Past History:  Past Medical History:    Current Problems:     HYPERLIPIDEMIA (ICD-272.4)    NIPPLE DISCHARGE (ICD-611.79)    SPECIAL SCREENING FOR OSTEOPOROSIS (ICD-V82.81)    SPECIAL SCREENING FOR MALIGNANT NEOPLASMS COLON (ICD-V76.51)    OTHER SCREENING BREAST EXAMINATION (ICD-V76.19)    NECK PAIN (ICD-723.1)    WELL ADULT EXAM (ICD-V70.0)    BACK PAIN, ACUTE (ICD-724.5)    CHEST PAIN (ICD-786.50)    ASTHMA (ICD-493.90)    INSOMNIA (ICD-780.52)    TOBACCO USER (ICD-305.1)    ARTHRITIS (ICD-716.90)    COMMON MIGRAINE (ICD-346.10)    OVERACTIVE BLADDER (ICD-596.51)    ASTHMA (ICD-493.90)    GERD (ICD-530.81)    DEPRESSION (ICD-311)    ANXIETY (ICD-300.00)    ALLERGIC RHINITIS (ICD-477.9)     (01/29/2008)  Family History:    father- stomach ca/ htn/ dm/ chf    mother-colon ca/ allergies    grandmother- kidney failure    3 brothers & 1 sister-htn/ dm    2 children- allergies (12/05/2006)  Social History:    Married    lives with spouse and children    Current Smoker    Alcohol use-yes    Drug use-no      (12/05/2006)  Risk Factors:    Alcohol Use: 3 (12/05/2006)    >5 drinks/d w/in last 3 months: N/A    Caffeine Use: N/A    Diet: N/A    Exercise: N/A  Risk Factors:    Smoking Status: current (12/05/2006)    Packs/Day: 1/2 (12/05/2006)    Cigars/wk: N/A    Pipe Use/wk: N/A    Cans of tobacco/wk: N/A    Passive Smoke Exposure: N/A  Past Surgical History:    1. Cholecystectomy    2.  Tubal ligation    3. colonic polyps with scope     4. breast cyst - left    5. R hand middle finger tendon repair due to glass injury with baby bottle    6. L leg - knee arthroscopy    7. 4 x D & C  Family History:    Father- stomach ca/ htn/ dm/ chf - age 58s - dead    Mother-colon ca/ allergies - age 48    3 brothers 71, 77, 36  & 1 sister 66 -htn/dm    2 children - boy 46 Sinus issues and Back problems and girl 55 - healthy   Social History:    Married x 4    Lives with spouse and children    Current Smoker    Alcohol use-yes    Drug use-no    HIghest level of education - 12 th grade and CNA degree    Housemaker  Review of Systems      See HPI General:  Denies chills, fever, and sweats. CV:  See HPI. Resp:  See HPI; Denies cough, shortness of breath, sputum productive, and wheezing. GI:  Denies abdominal pain, constipation, diarrhea, nausea, and vomiting. GU:  Denies nocturia, urinary frequency, and urinary hesitancy. Psych:  Denies anxiety and depression.  Physical Exam  General:  Well-developed,well-nourished,in no acute distress; alert,appropriate and cooperative throughout examination. Tired appearing. Head:  Normocephalic and atraumatic without obvious abnormalities. No apparent alopecia or balding. Eyes:  No corneal or conjunctival inflammation noted. EOMI. Perrla. No nystagmus. Ears:  External ear exam shows no significant lesions or deformities.  Otoscopic examination reveals clear canals, tympanic membranes are intact bilaterally without bulging, retraction, inflammation or  discharge. Hearing is grossly normal bilaterally. Nose:  External nasal examination shows no deformity or inflammation. Nasal mucosa are pink and moist without lesions or exudates. Mouth:  Oral mucosa and oropharynx without lesion. Neck:  No deformities, masses. On exam today tender to base of skull. Nomral ROM though. Chest Wall:  Tedner right ant chest wall. Lungs:  Normal respiratory effort, chest expands symmetrically. Lungs are clear to auscultation, no crackles or wheezes. Heart:  Normal rate and regular rhythm. S1 and S2 normal without gallop, murmur, click, rub or other extra sounds. Abdomen:  Bowel sounds positive,abdomen soft and non-tender without masses, organomegaly or hernias noted. Extremities:  No clubbing, cyanosis, edema, or deformity noted with normal full range of motion of all joints.   Neurologic:  No cranial nerve deficits noted. Station and gait are normal. Plantar reflexes are down-going bilaterally. DTRs are symmetrical throughout. Sensory, motor and coordinative functions appear intact. Cervical Nodes:  No lymphadenopathy noted Psych:  Cognition and judgment appear intact. Alert and cooperative with normal attention span and concentration. No apparent delusions, illusions, hallucinations   Impression & Recommendations:  Problem # 1:  RUQ PAIN (ICD-789.01) Hx of rib fractures 6 weeks ago. Now with pain here.  did a UA to assure no blood from urinary tract and this was clear. Reassured. I am going to check a CBC to eval for elevated WBC and possible infectious cuase though less likely in abscence of fever and significant cough. I will check her HGB to assure no occult bleeding and I will assess liver function. I councelled her to assure proper med use. Do not use ALve and Ibuprfoen together. I will refill Ultram and she may use 2 220 mg aleve two times a day as needed for pain. I will update once I have  labs. Pt agreeable. If signs worsen go to ED.  Problem # 2:  HEADACHE  (ICD-784.0) Post fall developed headache, vomitting and neck pain. Advised hx of migraines and this may be to blame. However, must rule out IC and C-spine abnormality. She is agreeable and states has felt pretty miserable with this. Refilled Rx and will get stat head and neck CT today. Await result and optomize. The following medications were removed from the medication list:    Tramadol Hcl 50 Mg Tabs (Tramadol hcl) .Marland Kitchen... 1-2 tabs q8hrs Her updated medication list for this problem includes:    Imitrex 50 Mg Tabs (Sumatriptan succinate) .Marland Kitchen... 1 tab q2hrs as needed but no more than two in 24 hours    Inderal La 80 Mg Cp24 (Propranolol hcl) ..... Once daily    Ultram 50 Mg Tabs (Tramadol hcl) ..... One every 6 hours  Orders: Radiology Referral (Radiology)  Problem # 3:  NECK PAIN (ICD-723.1) See above. The following medications were removed from the medication list:    Tramadol Hcl 50 Mg Tabs (Tramadol hcl) .Marland Kitchen... 1-2 tabs q8hrs    Flexeril 10 Mg Tabs (Cyclobenzaprine hcl) .Marland Kitchen... Three times a day Her updated medication list for this problem includes:    Ultram 50 Mg Tabs (Tramadol hcl) ..... One every 6 hours  Orders: Radiology Referral (Radiology)  Problem # 4:  HYPERLIPIDEMIA (ICD-272.4) Repeat lab as due and optomize. Orders: T-Comprehensive Metabolic Panel [id number]) T-Lipid Profile [phone]) T-TSH [phone]) Venipuncture (44010)  Problem # 5:  Preventive Health Care (ICD-V70.0) Well woman exam in 2 weeks. Agrees.   Complete Medication List: 1)  Imitrex 50 Mg Tabs (Sumatriptan succinate) .Marland Kitchen.. 1 tab q2hrs as needed but no more than two in 24 hours 2)  Meclizine Hcl 25 Mg Tabs (Meclizine hcl) .... One every 8 hours as needed for nausea with travel 3)  Inderal La 80 Mg Cp24 (Propranolol hcl) .... Once daily 4)  Detrol La 4 Mg Cp24 (Tolterodine tartrate) .... Once daily 5)  Proair Hfa Aers (Albuterol sulfate aers) .... 2 puffs every 6 hours as needed. 6)  Vitamin B-12  100 Mcg Tabs (Cyanocobalamin) .... One by mouth daily 7)  Singulair 10 Mg Tabs (Montelukast sodium) .... One daily 8)  Prilosec 20 Mg Cpdr (Omeprazole) .... One daily 9)  Promethazine Hcl 25 Mg Tabs (Promethazine hcl) .... Every 6 hrs as needed 10)  Ultram 50 Mg Tabs (Tramadol hcl) .... One every 6 hours  Other Orders: UA Dipstick w/o Micro (manual) (27253) T-CBC w/Diff ([id number])  Lipid Assessment/Plan:      Based on NCEP/ATP III, the patient's risk factor category is "0-1 risk factors".  From this information, the patient's calculated lipid goals are as follows: Total cholesterol goal is 200; LDL cholesterol goal is 160; HDL cholesterol goal is 40; Triglyceride goal is 150.     Patient Instructions: 1)  Please schedule a follow-up appointment in 2 weeks. Prescriptions: ULTRAM 50 MG TABS (TRAMADOL HCL) One every 6 hours  #120 x 0   Entered and Authorized by:   Franchot Heidelberg MD   Signed by:   Franchot Heidelberg MD on 01/28/2009   Method used:   Electronically to        [facility]* (retail)       [address] [place], [place]  [address]       Ph: [phone]       Fax: [phone]  RxID:   [id number] PROMETHAZINE HCL 25 MG TABS (PROMETHAZINE HCL) every 6 hrs as needed  #60 x 0   Entered and Authorized by:   Franchot Heidelberg MD   Signed by:   Franchot Heidelberg MD on 01/28/2009   Method used:   Electronically to        [facility]* (retail)       [address] [place], [place]  [address]       Ph: [phone]       Fax: [phone]   RxID:   [id number] MECLIZINE HCL 25 MG TABS (MECLIZINE HCL) One every 8 hours as needed for nausea with travel  #30 x 3   Entered and Authorized by:   Franchot Heidelberg MD   Signed by:   Franchot Heidelberg MD on 01/28/2009   Method used:   Electronically to        [facility]* (retail)       [address]  [place], [place]  [address]       Ph: [phone]       Fax: [phone]   RxID:   [id number] IMITREX 50 MG TABS (SUMATRIPTAN SUCCINATE) 1 tab q2hrs as needed but no more than two in 24 hours  #6 x 3   Entered and Authorized by:   Franchot Heidelberg MD   Signed by:   Franchot Heidelberg MD on 01/28/2009   Method used:   Electronically to        [facility]* (retail)       [address]       [place], [place]  [address]       Ph: [phone]       Fax: [phone]   RxID:   [id number]       Laboratory Results   Urine Tests    Routine Urinalysis   Color: yellow Appearance: Clear Glucose: negative   (Normal Range: Negative) Bilirubin: negative   (Normal Range: Negative) Ketone: negative   (Normal Range: Negative) Spec. Gravity: 1.010   (Normal Range: 1.003-1.035) Blood: negative   (Normal Range: Negative) pH: 6.5   (Normal Range: 5.0-8.0) Protein: negative   (Normal Range: Negative) Urobilinogen: 0.2   (Normal Range: 0-1) Nitrite: negative   (Normal Range: Negative) Leukocyte Esterace: negative   (Normal Range: Negative)        Preventive Care Screening  Colonoscopy:    Date:  07/05/2005    Next Due:  07/2009    Results:  abnormal - polyps - Dr. Lovell Sheehan

## 2010-11-16 NOTE — Assessment & Plan Note (Signed)
Summary: well woman physical/arc   Vital Signs:  Patient profile:   50 year old female Height:      67 inches Weight:      159 pounds O2 Sat:      98 % Temp:     98.2 degrees F Pulse rate:   88 / minute Resp:     15 per minute BP sitting:   121 / 68  Vitals Entered By: Sherilyn Banker LPN (February 11, 2009 10:15 AM) CC: Physical/ allergies, Lipid Management   Primary Provider:  Franchot Heidelberg, MD  CC:  Physical/ allergies and Lipid Management.  History of Present Illness: Pt in for physcial.  She has allergies. States miserable. She states came on suddenly yesterday. She has congestion, PND and states so much she is haorse, has sore throat and she is coughing. States cloudy mucous. She denies fever, chills and sweats. Has felt cold. She has been sneeizng. She has watery eyes. She denies ill contact and travel. She has symptoms Spring and Fall. States using Singulair daily and not cutting it.  She states her ribs are still hurting and getting better slowly. She states pain about a 4/10. Worse with pressure and with cough and better with no pressure. She had a fall a month ago and complained of neck pain last visit. She states neck feels a lot better now. CT head and neck were done and this is reviewed:  1. CT head - normal 2. CT Neck -   1.  No acute fracture or subluxation. 2.  4 mm right apical lung nodule. If the patient is at high risk for bronchogenic carcinoma, follow-up chest CT at 1 year is recommended. She is a smoker. Has done this for 7 years. She is agreeable with CT chest.   She states she needs her pap today. She denies breast pain, nipple retraction and notes cont with nipple dicahrge as related to her fibrocytic breast disease. She denies personal hx of breast cancer and notes fam hx of this  - "everybody". She does not use HRT or OCP. She states she is smoker. She uses ETOH on occasion. She was10 with her first period and last one was Sept 2009. She states doing lot  better since no periods. She denies menopousal symptoms. She denies vaginal discharge and drainage. She has had abnormal paps in the past and not sure of what. Had to have cone at age 29.  She is sexually active. Hx of STDS as husband gave her something - unsure of dx but was on abx.    She is agreeble with referal to Dr. Lovell Sheehan as due for colonoscopy. Has hx of polyps and due this year. Father and mother had colon cancer. She has decreased apetite today related to allergies. Denies nausea and vomiting. Does have lose stools and went ten times. Denies bloody stools.   Labs reviewed  - see EMR for details.  1. CBC - unremarkable 2. CMP - BS 102 an admits ate grapes morning of fasting test 3. Lipids  see below.  She now presents.  Lipid Management History:      Positive NCEP/ATP III risk factors include current tobacco user.  Negative NCEP/ATP III risk factors include female age less than 51 years old, non-diabetic, HDL cholesterol greater than 60, no family history for ischemic heart disease, non-hypertensive, no ASHD (atherosclerotic heart disease), no prior stroke/TIA, no peripheral vascular disease, and no history of aortic aneurysm.        The  patient states that she knows about the "Therapeutic Lifestyle Change" diet.  Her compliance with the TLC diet is fair.  The patient expresses understanding of adjunctive measures for cholesterol lowering.  Adjunctive measures started by the patient include aerobic exercise and fiber.     Preventive Screening-Counseling & Management     Alcohol drinks/day: 3     Alcohol type: Shots of Vodka occasionally     Smoking Status: current     Smoking Cessation Counseling: yes     Smoke Cessation Stage: precontemplative     Packs/Day: 1/2     Year Started: 2003  Current Problems (verified): 1)  Ruq Pain  (ICD-789.01) 2)  Headache  (ICD-784.0) 3)  Chest Pain, Right  (ICD-786.50) 4)  Benign Positional Vertigo  (ICD-386.11) 5)  Hyperlipidemia   (ICD-272.4) 6)  Nipple Discharge  (ICD-611.79) 7)  Neck Pain  (ICD-723.1) 8)  Asthma  (ICD-493.90) 9)  Insomnia  (ICD-780.52) 10)  Tobacco User  (ICD-305.1) 11)  Arthritis  (ICD-716.90) 12)  Common Migraine  (ICD-346.10) 13)  Overactive Bladder  (ICD-596.51) 14)  Asthma  (ICD-493.90) 15)  Gerd  (ICD-530.81) 16)  Depression  (ICD-311) 17)  Anxiety  (ICD-300.00) 18)  Allergic Rhinitis  (ICD-477.9)  Current Medications (verified): 1)  Imitrex 50 Mg Tabs (Sumatriptan Succinate) .Marland Kitchen.. 1 Tab Q2hrs As Needed But No More Than Two in 24 Hours 2)  Meclizine Hcl 25 Mg Tabs (Meclizine Hcl) .... One Every 8 Hours As Needed For Nausea With Travel 3)  Inderal La 80 Mg Cp24 (Propranolol Hcl) .... Once Daily 4)  Detrol La 4 Mg Cp24 (Tolterodine Tartrate) .... Once Daily 5)  Proair Hfa  Aers (Albuterol Sulfate Aers) .... 2 Puffs Every 6 Hours As Needed. 6)  Vitamin B-12 100 Mcg Tabs (Cyanocobalamin) .... One By Mouth Daily 7)  Singulair 10 Mg Tabs (Montelukast Sodium) .... One Daily 8)  Prilosec 20 Mg Cpdr (Omeprazole) .... One Daily 9)  Promethazine Hcl 25 Mg Tabs (Promethazine Hcl) .... Every 6 Hrs As Needed 10)  Ultram 50 Mg Tabs (Tramadol Hcl) .... One Every 6 Hours  Allergies (verified): 1)  ! Penicillin 2)  ! Cephalosporins 3)  ! * Betalactams 4)  ! * Carbapenem 5)  ! * Penicillamine 6)  ! * Salicylates 7)  ! * Pyrazole Antipyrectics 8)  ! Nsaids 9)  ! * Sulfinpyrazone 10)  ! * Opiod Analgesics 11)  ! Morphine 12)  ! Codeine  Past History:  Past Medical History:    Current Problems:     HYPERLIPIDEMIA (ICD-272.4)    NIPPLE DISCHARGE (ICD-611.79)    SPECIAL SCREENING FOR OSTEOPOROSIS (ICD-V82.81)    SPECIAL SCREENING FOR MALIGNANT NEOPLASMS COLON (ICD-V76.51)    OTHER SCREENING BREAST EXAMINATION (ICD-V76.19)    NECK PAIN (ICD-723.1)    WELL ADULT EXAM (ICD-V70.0)    BACK PAIN, ACUTE (ICD-724.5)    CHEST PAIN (ICD-786.50)    ASTHMA (ICD-493.90)    INSOMNIA (ICD-780.52)     TOBACCO USER (ICD-305.1)    ARTHRITIS (ICD-716.90)    COMMON MIGRAINE (ICD-346.10)    OVERACTIVE BLADDER (ICD-596.51)    ASTHMA (ICD-493.90)    GERD (ICD-530.81)    DEPRESSION (ICD-311)    ANXIETY (ICD-300.00)    ALLERGIC RHINITIS (ICD-477.9)     (01/29/2008)  Past Surgical History:    1. Cholecystectomy    2. Tubal ligation    3. colonic polyps with scope     4. breast cyst - left    5. R hand middle finger  tendon repair due to glass injury with baby bottle    6. L leg - knee arthroscopy    7. 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 Tired appearing. Hoarse. Head:  Normocephalic and atraumatic without obvious abnormalities. No apparent alopecia or balding. Eyes:  No corneal or conjunctival inflammation noted. EOMI. Perrla. No nystagmus. Ears:  External ear exam shows no significant lesions or deformities.  Otoscopic examination reveals clear canals, tympanic membranes are intact bilaterally without bulging, retraction, inflammation or discharge. Hearing is  grossly normal bilaterally. Nose:  MIlod clear congestion and turbinate swelling. Mouth:  Oral mucosa and oropharynx without lesion. Neck:  No deformities, masses, or tenderness noted. Breasts:  No mass, nodules, thickening, tenderness, bulging, retraction, inflamation, nipple discharge or skin changes noted.   Lungs:  Normal respiratory effort, chest expands symmetrically. Lungs are clear to auscultation, no crackles or wheezes. Heart:  Normal rate and regular rhythm. S1 and S2 normal without gallop, murmur, click, rub or other extra sounds. Abdomen:  Bowel sounds positive,abdomen soft and non-tender without masses, organomegaly or hernias noted. Genitalia:  Normal introitus for age, no external lesions, no vaginal discharge, mucosa pink and moist, no vaginal or cervical lesions, no vaginal atrophy, no friaility or hemorrhage, normal uterus size and position, no adnexal masses or tenderness Extremities:  No clubbing, cyanosis, edema, or deformity noted with normal full range of motion of all joints.   Neurologic:  No cranial nerve deficits noted. Station and gait are normal. Plantar reflexes are down-going bilaterally. DTRs are symmetrical throughout. Sensory, motor and coordinative functions appear intact. Cervical Nodes:  No lymphadenopathy noted Axillary Nodes:  No palpable lymphadenopathy Psych:  Cognition and judgment appear intact. Alert and cooperative with normal attention span and concentration. No apparent delusions, illusions, hallucinations   Impression & Recommendations:  Problem # 1:  ALLERGIC RHINITIS (ICD-477.9) Severe. Cont Singulair daily. Add Lodrane 24 and gave sampes for 2 weeks. Push fluids, rest and use chloraseptic for sore throat. Update if worsen or do not improve. Her updated medication list for this problem includes:    Promethazine Hcl 25 Mg Tabs (Promethazine hcl) ..... Every 6 hrs as needed  Orders: Depo- Medrol 80mg  (J1040) Dexamethasone Sodium Phosphate 1mg   (J1100) Admin of Therapeutic Inj  intramuscular  or subcutaneous (04540)  Problem # 2:  CHEST PAIN, RIGHT (ICD-786.50) Rib fractures healing post fall. Ultram as needed. Cont breatjing exersise to offset atelectasis and pneumonia risk.  Problem # 3:  NECK PAIN (ICD-723.1) Normal neck CT. However, new finding of RUL nodule. Hx of tobacco abuse. Refer chest CT and optomize.  Her updated medication list for this problem includes:    Ultram 50 Mg Tabs (Tramadol hcl) ..... One every 6 hours  Problem # 4:  TOBACCO USER (ICD-305.1) See above. Urged to quit smoking. Not open to rx. States will try on own.  Problem # 5:  HYPERLIPIDEMIA (ICD-272.4) LDL, HDL and Trig to goal. TC above 200 and urged TLC wit healthy diet, 30 minutes of daily exersize and portion control. Recheck 12 months.  Problem # 6:  Preventive Health Care (ICD-V70.0) Well woman exam complete. Await thin prep pap. Refer screening mammo. Refer screening colonoscopy with Dr. Lovell Sheehan as due with strong fam hx of colon cancer. Nurse to assist.  Complete Medication List: 1)  Imitrex 50 Mg Tabs (Sumatriptan succinate) .Marland Kitchen.. 1 tab q2hrs as needed but no more than two in 24 hours 2)  Meclizine Hcl 25 Mg Tabs (Meclizine hcl) .... One every 8 hours as needed for nausea with travel 3)  Inderal La 80 Mg Cp24 (Propranolol hcl) .... Once daily 4)  Detrol La 4 Mg Cp24 (Tolterodine tartrate) .... Once daily 5)  Proair Hfa Aers (Albuterol sulfate aers) .... 2 puffs every 6 hours as needed. 6)  Vitamin B-12 100 Mcg Tabs (Cyanocobalamin) .... One by mouth daily 7)  Singulair 10 Mg Tabs (Montelukast sodium) .... One daily 8)  Prilosec 20 Mg Cpdr (Omeprazole) .... One daily 9)  Promethazine Hcl 25 Mg Tabs (Promethazine hcl) .... Every 6 hrs as needed 10)  Ultram 50 Mg Tabs (Tramadol hcl) .... One every 6 hours  Other Orders: Radiology Referral (Radiology) Radiology Referral (Radiology) Surgical Referral (Surgery)  Lipid Assessment/Plan:       Based on NCEP/ATP III, the patient's risk factor category is "2 or more risk factors and a calculated 10 year CAD risk of < 20%".  From this information, the patient's calculated lipid goals are as follows: Total cholesterol goal is 200; LDL cholesterol goal is 130; HDL cholesterol goal is 40; Triglyceride goal is 150.    Patient Instructions: 1)  Please schedule a follow-up appointment in 6 weeks.   Preventive Care Screening  Prior Values:    Pap Smear:  normal (11/13/2007)    Mammogram:  Fibrocytic breast disease (11/21/2007)    Colonoscopy:  abnormal - polyps - Dr. Lovell Sheehan (07/05/2005)    Bone Density:  normal (11/21/2007)    Last Flu Shot:  Fluvax Non-MCR (11/13/2007)    Last Pneumovax:  Pneumovax (11/13/2007)    Dexa Interp:  normal (11/21/2007)     Medication Administration  Injection # 1:    Medication: Depo- Medrol 80mg     Diagnosis: ALLERGIC RHINITIS (ICD-477.9)    Route: IM    Site: L thigh    Exp Date: 04/17/2011    Lot #: [id number]    Mfr: Pharm[employer]    Patient tolerated injection without complications    Given by: Sherilyn Banker LPN (February 11, 2009 11:03 AM)  Injection # 2:    Medication: Dexamethasone Sodium Phosphate 1mg     Diagnosis: ALLERGIC RHINITIS (ICD-477.9)    Route: IM    Site: L thigh    Exp Date: 07/17/2010    Lot #: [id number]  Mfr: Equities trader    Patient tolerated injection without complications    Given by: Sherilyn Banker LPN (February 11, 2009 11:04 AM)  Orders Added: 1)  Depo- Medrol 80mg  [J1040] 2)  Dexamethasone Sodium Phosphate 1mg  [J1100] 3)  Admin of Therapeutic Inj  intramuscular or subcutaneous [96372] 4)  Est. Patient Level IV [54098] 5)  Radiology Referral [Radiology] 6)  Radiology Referral [Radiology] 7)  Surgical Referral [Surgery]   Appended Document: well woman physical/arc Pt scheduled for mammogram and chest ct on wednesday, 5 May at 0920.  Register at 0850 at [facility].  clear liquids only 2 hours prior to test and bring  medications.   Patient advised of the above.  Appended Document: well woman physical/arc Pt scheduled with Dr. Lovell Sheehan on Tuesday, May 11th at 2:30.  Patient advised of the above.

## 2010-11-16 NOTE — Progress Notes (Signed)
Summary: 11/13/07 lab results  Phone Note Outgoing Call   Call placed by: Sonny Dandy,  November 14, 2007 11:57 AM Summary of Call: Results given to patient, voices understanding   Initial call taken by: Sonny Dandy,  November 14, 2007 11:57 AM

## 2010-11-16 NOTE — Progress Notes (Signed)
Summary: back pain  Phone Note Call from Patient   Caller: Patient Summary of Call: called and c/o back pain, is out of Flexeril. Has been using Ibuprofen and Aleve with out much relief. Wanting Flexeril back. I advised to try an ice pack while Dr Erby Pian reviews note. Initial call taken by: Sonny Dandy,  November 20, 2007 2:06 PM  Follow-up for Phone Call        One refill on Flexeril 10 mg three times a day for 5 days. If sx persist, will need PT as we cannot use Flexeril longtherm. Follow-up by: Franchot Heidelberg MD,  November 20, 2007 2:09 PM  Additional Follow-up for Phone Call Additional follow up Details #1::        Spoke with paitent and instructions given, script called to [place]. Additional Follow-up by: Sonny Dandy,  November 20, 2007 2:15 PM

## 2010-11-16 NOTE — Medication Information (Signed)
Summary: Tax adviser   Imported By: Curtis Sites 11/16/2007 08:58:27  _____________________________________________________________________  External Attachment:    Type:   Image     Comment:   External Document

## 2010-11-16 NOTE — Progress Notes (Signed)
Summary: Allergy List  Allergy List   Imported By: Donneta Romberg 03/08/2007 09:53:15  _____________________________________________________________________  External Attachment:    Type:   Image     Comment:   External Document

## 2010-11-16 NOTE — Letter (Signed)
Summary: CT results  [facility]  [address]   [place], [place] [address]   Phone: [phone]  Fax: [phone]    02/19/2009  Savannah Trevino [address] [place], [place]  [address]  Dear [name],  The results of your recent CT Scan has been reviewed and found:   Multiple Benign appearing Nodules Need to quit smoking CT need to be repeated in 12 months       Sincerely,   Sherilyn Banker LPN

## 2010-11-16 NOTE — Medication Information (Signed)
Summary: Tax adviser   Imported By: Pablo Lawrence 04/30/2007 14:25:53  _____________________________________________________________________  External Attachment:    Type:   Image     Comment:   External Document

## 2010-11-16 NOTE — Progress Notes (Signed)
Summary: drug allergies/ antibiotic script.  Phone Note Call from Patient   Caller: Patient Summary of Call: pt states that she received an antibiotic from the ER that she is allergic to and she wants to see the Dr. today. I told her that the Dr's would not be back until friday and  if she received the script from the ER then she needed to call the ER about that. She also wants to know why we did not have her drug allergies in her chart because she did not know what she was allergic to yesterday at the ER. She wanted to know what  DR was on call today. She states that she put all her drug allergies on her H&P, but there is nothing on her H&P that she is allergic to,but she did verbally tell us that she was allergic to penicillin. She did not want to tell me over the phone what her allergies are, she will bring [place] a list today. Initial call taken by: Sherilyn Banker,  Mar 07, 2007 11:40 AM

## 2010-11-16 NOTE — Assessment & Plan Note (Signed)
Summary: physical/arc   Vital Signs:  Patient Profile:   50 Years Old Female Height:     67 inches O2 Sat:      100 % Temp:     97.8 degrees F Pulse rate:   82 / minute Resp:     14 per minute BP sitting:   124 / 75  Vitals Entered By: Sherilyn Banker (November 13, 2007 11:00 AM)                 PCP:  Franchot Heidelberg, MD  Chief Complaint:  pap and mammogram referal.  History of Present Illness: Pt in for her annual physical.  She was seen yesterday due to back and neck pain. Got steroid injection and mucle relaxer. Has helped a lot. Xrays were done and results are reviewed: See EMR. No acute findings. Happy with this.  She had labs as part of her annual.  1. CBC - normal 2. CMP - glucose 111 - hx  of eating grapefruit in am. 3. TSH - normal 4. LIpids - see below.   She has not had breast exam or mammo in 2 to 3 years. She denies breast pain and notes tender at times. States hx of fibrocystic breast disease. Has had biopsies on these. States tenderness since. Notes nipples has clear to milky to black-green discharge at times and was told related to fibrocystic breast dz. States would like diagnostic mammogram as well as this is what she ends up having anyway.  Has been told part of fibrocystic disease in past per female GYN. Has strong fam hx of breast cancer - grandmother, her children.  SHe is a smoker. She is [id number]. She was 10 with her first period. Last a few weeks ago. States cycles irregular. Notes skips a month, then has spotting, then normal. She has vaginal dryness. States irritable. Has had some hot flashes. Denies hx of abnormal pap-smears. Notes hx of STDs - not sure which one but took abx.  Her apetite is good. Denies nausea and vomitting.No diarrhea or constipation. Notes father had colon cancer at age 41s. She states she has a hx of polyps and her last scope was 60. She states Dr. Lovell Sheehan did this. She is agreeable with re-referal and would like to see him  again.  She read about Chantix as she has asthma and smokes. States read insert and a little hesitant. She has abused ETOH in past. Curious about alternatives.  As used patches and got migraines. Same with gum.  States had lost eght over years. Notes has had to use steroid packs for her asthma in past. Has not had flu or pneumovax yet. Requests this as well.  Now presents.   Lipid Management History:      Positive NCEP/ATP III risk factors include current tobacco user.  Negative NCEP/ATP III risk factors include female age less than 19 years old, non-diabetic, no family history for ischemic heart disease, non-hypertensive, no ASHD (atherosclerotic heart disease), no prior stroke/TIA, no peripheral vascular disease, and no history of aortic aneurysm.        The patient states that she knows about the "Therapeutic Lifestyle Change" diet.  Her compliance with the TLC diet is fair.  The patient expresses understanding of adjunctive measures for cholesterol lowering.  Adjunctive measures started by the patient include aerobic exercise, fiber, ASA, and omega-3 supplements.     Current Allergies (reviewed today): ! PENICILLIN ! CEPHALOSPORINS ! * BETALACTAMS ! * CARBAPENEM ! * PENICILLAMINE ! *  SALICYLATES ! * PYRAZOLE ANTIPYRECTICS ! NSAIDS ! * SULFINPYRAZONE ! * OPIOD ANALGESICS ! MORPHINE ! CODEINE  Past Medical History:    Reviewed history from 12/05/2006 and no changes required:       Allergic rhinitis       Anxiety       Depression       GERD  Past Surgical History:    Reviewed history from 12/05/2006 and no changes required:       Cholecystectomy       Tubal ligation       prolapsed colon       breast cyst       R hand       L leg       4 D & C   Family History:    Reviewed history from 12/05/2006 and no changes required:       father- stomach ca/ htn/ dm/ chf       mother-colon ca/ allergies       grandmother- kidney failure       3 brothers & 1 sister-htn/ dm        2 children- allergies  Social History:    Reviewed history from 12/05/2006 and no changes required:       Married       lives with spouse and children       Current Smoker       Alcohol use-yes       Drug use-no   Risk Factors:  Tobacco use:  current    Year started:  2003    Cigarettes:  Yes -- 1/2 pack(s) per day    Counseled to quit/cut down tobacco use:  yes Drug use:  no Alcohol use:  yes    Type:  Shots of Vodka    Drinks per day:  3  Family History Risk Factors:    Family History of MI in females < 64 years old:  no    Family History of MI in males < 2 years old:  no   Review of Systems      See HPI  General      Denies chills, fever, and sweats.  CV      Denies chest pain or discomfort, swelling of feet, swelling of hands, and weight gain.  Resp      Denies cough, shortness of breath, sputum productive, and wheezing.  GI      See HPI  GU      Denies nocturia, urinary frequency, and urinary hesitancy.  Neuro      Denies brief paralysis, headaches, seizures, and tremors.  Psych      Denies anxiety, depression, mental problems, sense of great danger, and thoughts /plans of harming others.      Doing well emotionally. Mom passed. Has dealth with this.  Endo      Denies cold intolerance, excessive hunger, excessive thirst, excessive urination, heat intolerance, polyuria, and weight change.  Heme      Denies abnormal bruising, bleeding, enlarge lymph nodes, fevers, pallor, and skin discoloration.   Physical Exam  General:     Well-developed,well-nourished,in no acute distress; alert,appropriate and cooperative throughout examination Head:     Normocephalic and atraumatic without obvious abnormalities. No apparent alopecia or balding. Eyes:     Mild injection. Ears:     External ear exam shows no significant lesions or deformities.  Otoscopic examination reveals clear canals, tympanic membranes are intact bilaterally without bulging, retraction,  inflammation or discharge. Hearing is grossly normal bilaterally. Nose:     External nasal examination shows no deformity or inflammation. Nasal mucosa are pink and moist without lesions or exudates. Mouth:     Oral mucosa and oropharynx without lesions or exudates.   Neck:     No deformities, masses, or tenderness noted. Breasts:     No mass, nodules, thickening, tenderness, bulging, retraction, inflamation,or skin changes noted.  She has however a thick green black discharge from nipples.  Lungs:     Normal respiratory effort, chest expands symmetrically. Lungs are clear to auscultation, no crackles or wheezes. Heart:     Normal rate and regular rhythm. S1 and S2 normal without gallop, murmur, click, rub or other extra sounds. Abdomen:     Bowel sounds positive,abdomen soft and non-tender without masses, organomegaly or hernias noted. Genitalia:     Normal introitus for age, no external lesions, no vaginal discharge, mucosa pink and moist, no vaginal or cervical lesions, no vaginal atrophy, no friaility or hemorrhage, normal uterus size and position with scant blood from os, no adnexal masses or tenderness Extremities:     No clubbing, cyanosis, edema, or deformity noted with normal full range of motion of all joints.   Neurologic:     No cranial nerve deficits noted. Station and gait are normal. Plantar reflexes are down-going bilaterally. DTRs are symmetrical throughout. Sensory, motor and coordinative functions appear intact. Cervical Nodes:     No lymphadenopathy noted Axillary Nodes:     No palpable lymphadenopathy Psych:     Cognition and judgment appear intact. Alert and cooperative with normal attention span and concentration. No apparent delusions, illusions, hallucinations    Impression & Recommendations:  Problem # 1:  NECK PAIN (ICD-723.1) Disucssed.Sx improving. Rx as is. Councelled proper lifting and exerisize techniques. Her updated medication list for this problem  includes:    Tramadol Hcl 50 Mg Tabs (Tramadol hcl) .Marland Kitchen... 1-2 tabs q8hrs    Flexeril 10 Mg Tabs (Cyclobenzaprine hcl) .Marland Kitchen... Three times a day   Problem # 2:  BACK PAIN, ACUTE (ICD-724.5) See above. Her updated medication list for this problem includes:    Tramadol Hcl 50 Mg Tabs (Tramadol hcl) .Marland Kitchen... 1-2 tabs q8hrs    Flexeril 10 Mg Tabs (Cyclobenzaprine hcl) .Marland Kitchen... Three times a day   Problem # 3:  ASTHMA (ICD-493.90) Stable. Rx as is. Quit smoking. See below. Her updated medication list for this problem includes:    Proair Hfa Aers (Albuterol sulfate aers) .Marland Kitchen... 2 puffs every 6 hours as needed.    Singulair 10 Mg Tabs (Montelukast sodium) ..... One daily   Problem # 4:  TOBACCO USER (ICD-305.1) Reviewed Chantix and discussed risk and benefit. She wants to try this and notes nothing else has helped. Councelled method of use and will start low dose only with recheck in 2 weeks. Aware of risk and beneift of not quitting smoking. Low dose only per her request as she terms self very sensitive to pills. Councelled DC if any menatl chnages. Agrees. Her updated medication list for this problem includes:    Chantix 0.5 Mg Tabs (Varenicline tartrate) ..... One daily   Problem # 5:  HYPERLIPIDEMIA (ICD-272.4) TLC only.   Problem # 6:  NIPPLE DISCHARGE (ICD-611.79) New finding. Get dx mammogram. Check labs. Consider surgical consult. Orders: T- * Misc. Laboratory test [phone]) Venipuncture (33295)   Problem # 7:  WELL ADULT EXAM (ICD-V70.0) Female exam complete. Get dx mammo. Await pap. Refer Dr.  Lovell Sheehan as she wants him to do repeat colonoscopy with strong fam hx of colon cancer. Also get Dexa related to hx of asthma with steroid use and report of height loss. Updated flu and pneumovax. Councelled dental care, eye care, diet and exersize. Agrees. Copy of labs faxed to [employer] per her request. Recheck 2 weeks and optomise.  Complete Medication List: 1)  Tramadol Hcl 50 Mg Tabs (Tramadol hcl)  .Marland Kitchen.. 1-2 tabs q8hrs 2)  Imitrex 50 Mg Tabs (Sumatriptan succinate) .Marland Kitchen.. 1 tab q2hrs as needed 3)  Meclizine Hcl 25 Mg Tabs (Meclizine hcl) .... Q8hrs 4)  Inderal La 80 Mg Cp24 (Propranolol hcl) .... Once daily 5)  Detrol La 4 Mg Cp24 (Tolterodine tartrate) .... Once daily 6)  Proair Hfa Aers (Albuterol sulfate aers) .... 2 puffs every 6 hours as needed. 7)  Vitamin B-12 100 Mcg Tabs (Cyanocobalamin) .... One by mouth daily 8)  Zoloft 100 Mg Tabs (Sertraline hcl) .... 1/2 by mouth daily 9)  Singulair 10 Mg Tabs (Montelukast sodium) .... One daily 10)  Hydroxyzine Hcl 50 Mg Tabs (Hydroxyzine hcl) .... One by mouth every 6 hours as needed. 11)  Prilosec 20 Mg Cpdr (Omeprazole) .... One daily 12)  Flexeril 10 Mg Tabs (Cyclobenzaprine hcl) .... Three times a day 13)  Chantix 0.5 Mg Tabs (Varenicline tartrate) .... One daily  Other Orders: Surgical Referral (Surgery) Radiology Referral (Radiology) Influenza Vaccine NON MCR [phone]) Pneumococcal Vaccine (96295) Admin 1st Vaccine (28413)  Lipid Assessment/Plan:      Based on NCEP/ATP III, the patient's risk factor category is "0-1 risk factors".  From this information, the patient's calculated lipid goals are as follows: Total cholesterol goal is 200; LDL cholesterol goal is 160; HDL cholesterol goal is 40; Triglyceride goal is 150.     Patient Instructions: 1)  Please schedule a follow-up appointment in 2 weeks.    Prescriptions: CHANTIX 0.5 MG  TABS (VARENICLINE TARTRATE) One daily  #14 x 0   Entered and Authorized by:   Franchot Heidelberg MD   Signed by:   Franchot Heidelberg MD on 11/13/2007   Method used:   Print then Give to Patient   RxID:   [id number]  ]  Preventive Care Screening  Last Pneumovax:    Date:  11/13/2007    Next Due:  11/2012    Results:  given  Last Flu Shot:    Date:  11/13/2007    Next Due:  11/2008    Results:  given   Pneumovax Vaccine    Vaccine Type: Pneumovax    Site: right deltoid     Dose: 0.5 ml    Route: IM    Given by: Sherilyn Banker    Exp. Date: 03/17/2008    VIS given: 05/14/96 version given November 13, 2007.  Influenza Vaccine    Vaccine Type: Fluvax Non-MCR    Site: left deltoid    Dose: 0.5 ml    Route: IM    Given by: Sherilyn Banker    Exp. Date: 02/15/2008    VIS given: 05/10/07 version given November 13, 2007.  Flu Vaccine Consent Questions    Do you have a history of severe allergic reactions to this vaccine? no    Any prior history of allergic reactions to egg and/or gelatin? no    Do you have a sensitivity to the preservative Thimersol? no    Do you have a past history of Guillan-Barre Syndrome? no    Do you currently have an acute  febrile illness? no    Have you ever had a severe reaction to latex? no    Vaccine information given and explained to patient? yes    Are you currently pregnant? no

## 2010-11-16 NOTE — Assessment & Plan Note (Signed)
Summary: 6 WK F/U   Vital Signs:  Patient Profile:   50 Years Old Female Height:     67 inches Weight:      152 pounds BMI:     23.89 O2 Sat:      100 % Temp:     97.8 degrees F Pulse rate:   83 / minute Resp:     16 per minute BP sitting:   111 / 72  Vitals Entered By: Sherilyn Banker (Feb 27, 2007 2:54 PM)               PCP:  Franchot Heidelberg, MD  Chief Complaint:  allergies.  History of Present Illness: Pt in for recheck.  She is complaining cough and insmonia related to this. She has had sx for four weeks. She states she has nasal congestion, minimal sneezing. Her ears are full. She has PND and is swallowing and has cough productive with green mucous. She has had some wheezing. She has a hx of asthma. She feels hot and cold. SHe has self treated with Alka Seltzer Cold and Tylenol Allergy. Nothing helps. She has been using her Proair as needed - one evry 6 hours as needed.  She has no ill contacts and notes sx started after travelling to [place]. She took her Mother -in-law home. SHe does not do pekaflow. Notes lots of acts and dogs at home she stayed at.  SHe denies chest pain, orthopnea, PND and palpiations.  Apetitie is poor. Loose stools noted. No diarrhea. No constipation. Has been nauseated but denies diarrhea.  She also went to see Psychology. States she saw [employer]. Feels she is doing better emotionally. Gets upset at times. Needs something to relax her when she gets nervous. Curious if she can have Xanax until she sees Dr. Lolly Mustache - has appt towards the end of month. Stll not sleeping well - Rozerem kept her up. She has cut her Zoloft down to half a pill every day. SHe is irritable but less. Concentration better. Notes when she gets anxious she wants to drink. She had a drink last night - brandy - one glass. No suicidal ideations.  Now presents.  Current Allergies (reviewed today): No known allergies   Past Medical History:    Reviewed history from  12/05/2006 and no changes required:       Allergic rhinitis       Anxiety       Depression       GERD  Past Surgical History:    Reviewed history from 12/05/2006 and no changes required:       Cholecystectomy       Tubal ligation       prolapsed colon       breast cyst       R hand       L leg       4 D & C   Family History:    Reviewed history from 12/05/2006 and no changes required:       father- stomach ca/ htn/ dm/ chf       mother-colon ca/ allergies       grandmother- kidney failure       3 brothers & 1 sister-htn/ dm       2 children- allergies  Social History:    Reviewed history from 12/05/2006 and no changes required:       Married       lives with spouse and children  Current Smoker       Alcohol use-yes       Drug use-no    Review of Systems      See HPI   Physical Exam  General:     Well-developed,well-nourished,in no acute distress; alert,appropriate and cooperative throughout examination. VCoughing a lot. Pale appearing. States feeling sick and hates coughing. Head:     Normocephalic and atraumatic without obvious abnormalities. No apparent alopecia or balding. Eyes:     Mild injection. Ears:     External ear exam shows no significant lesions or deformities.  Otoscopic examination reveals clear canals, tympanic membranes are intact bilaterally without bulging, retraction, inflammation or discharge. Hearing is grossly normal bilaterally. Nose:     Mod turbinate inflammation with clear liquid both nares. Mouth:     Oral mucosa and oropharynx without lesions or exudates. Lungs:     Decreased BS with in and exp wheezes. Heart:     Normal rate and regular rhythm. S1 and S2 normal without gallop, murmur, click, rub or other extra sounds. Abdomen:     Bowel sounds positive,abdomen soft and non-tender without masses, organomegaly or hernias noted. Extremities:     No clubbing, cyanosis, edema, or deformity noted with normal full range of motion  of all joints.      Impression & Recommendations:  Problem # 1:  ASTHMA, WITH ACUTE EXACERBATION (ICD-493.92) Discussed. Sx better with single neb. Start abx, medrol pack and singulair. Use Proair as directed. if sx worsen, follow-up immediately. Advised need to do peakflows. Free one given and patient instructed on use and benefit. To log levels and wil recheck 4 weeks - obviously sooner if breathing worsens. Baseline is 440 - average this afternoon 330 on three attempts. Her updated medication list for this problem includes:    Proair Hfa Aers (Albuterol sulfate aers) .Marland Kitchen... 2 puffs every 6 hours as needed.    Medrol (pak) 4 Mg Tabs (Methylprednisolone) .Marland Kitchen... As directed    Singulair 10 Mg Tabs (Montelukast sodium) ..... One daily  Orders: Albuterol Sulfate Sol (P2951) Admin of Therapeutic Inj  intramuscular or subcutaneous (90772) Peak Flow Rate (94150)   Problem # 2:  INSOMNIA (ICD-780.52) DC Rozerem as not working. Disagree with Child psychotherapist and need for"xanax". Trial Vistaril. Risk and benefit outlined. Zoloft as is. Await psych input. The following medications were removed from the medication list:    Rozerem 8 Mg Tabs (Ramelteon) ..... One 30 min to 1 hour before sleep   Problem # 3:  ETHANOL ABUSE (ICD-305.00) Avoid at all cost. Pt councelled.  Problem # 4:  ANXIETY (ICD-300.00) Start Atarax. Councelled med use as is. Await Psych input. Her updated medication list for this problem includes:    Zoloft 100 Mg Tabs (Sertraline hcl) .Marland Kitchen... 1/2 by mouth daily    Hydroxyzine Hcl 50 Mg Tabs (Hydroxyzine hcl) ..... One by mouth every 6 hours as needed.   Problem # 5:  TOBACCO USER (ICD-305.1) Aware of need to quit. Refuses.  Problem # 6:  Preventive Health Care (ICD-V70.0) Optomize near future.  Medications Added to Medication List This Visit: 1)  Zoloft 100 Mg Tabs (Sertraline hcl) .... 1/2 by mouth daily 2)  Medrol (pak) 4 Mg Tabs (Methylprednisolone) .... As directed 3)   Doxy-caps 100 Mg Caps (Doxycycline hyclate) .... Two times a day 4)  Singulair 10 Mg Tabs (Montelukast sodium) .... One daily 5)  Hydroxyzine Hcl 50 Mg Tabs (Hydroxyzine hcl) .... One by mouth every 6 hours as needed.  Medication Administration  Medication # 1:    Medication: Albuterol Sulfate Sol    Diagnosis: ASTHMA, WITH ACUTE EXACERBATION (WNU-272.53)    Dose: 2.5mg     Route: inhaled    Lot #: [id number]    Mfr: dey    Patient tolerated medication without complications    Given by: Sherilyn Banker (Feb 27, 2007 3:25 PM)  Orders Added: 1)  Albuterol Sulfate Sol [K7425] 2)  Admin of Therapeutic Inj  intramuscular or subcutaneous [90772] 3)  Est. Patient Level IV [95638] 4)  Peak Flow Rate [94150]

## 2010-11-16 NOTE — Progress Notes (Signed)
Summary: New med  Phone Note Outgoing Call   Call placed by: Sonny Dandy,  November 23, 2007 9:35 AM Summary of Call: Patient notified of new OTC med... Caltrate +D 600mg  bid Initial call taken by: Sonny Dandy,  November 23, 2007 9:36 AM

## 2010-11-16 NOTE — Assessment & Plan Note (Signed)
Summary: lower back pain - work in per al   Vital Signs:  Patient Profile:   50 Years Old Female Height:     67 inches Weight:      155 pounds BMI:     24.36 O2 Sat:      100 % Pulse rate:   84 / minute Resp:     18 per minute BP sitting:   135 / 71  Vitals Entered By: Sherilyn Banker (March 23, 2007 2:59 PM)               PCP:  Franchot Heidelberg, MD  Chief Complaint:  lower back pain that radiates down R leg x 4 days.  History of Present Illness: Pt in with acute back pain.  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  She states she has pain over lower back. Right side. Radiates to leg. Takes breath away. Worse with any type of motion. Sharp and shooting. Sx for 4 days. Notes prior hx of possible herniated disc and has had chiropracter adjustments for this in past. She notes she cannot remember what she did when she got the pain. She has not self treated except for Aleve and Ibuprofen. Used max dose few times a day with minimal relief. Was in tears lst night and tried heat and ice. No injury. No fecal or urinary incontinence.  Now presents.  Current Allergies (reviewed today): ! PENICILLIN ! CEPHALOSPORINS ! * BETALACTAMS ! * CARBAPENEM ! * PENICILLAMINE ! * SALICYLATES ! * PYRAZOLE ANTIPYRECTICS ! NSAIDS ! * SULFINPYRAZONE ! * OPIOD ANALGESICS ! MORPHINE ! CODEINE  Past Medical History:    Reviewed history from 12/05/2006 and no changes required:       Allergic rhinitis       Anxiety       Depression       GERD  Past Surgical History:    Reviewed history from 12/05/2006 and no changes required:       Cholecystectomy       Tubal ligation       prolapsed colon       breast cyst       R hand       L leg       4 D & C   Family History:    Reviewed history from 12/05/2006 and no changes required:       father- stomach ca/ htn/ dm/ chf       mother-colon ca/ allergies       grandmother- kidney failure       3 brothers & 1 sister-htn/  dm       2 children- allergies  Social History:    Reviewed history from 12/05/2006 and no changes required:       Married       lives with spouse and children       Current Smoker       Alcohol use-yes       Drug use-no    Review of Systems      See HPI   Physical Exam  General:     Well-developed,well-nourished,in no acute distress; alert,appropriate and cooperative throughout examination Lungs:     Normal respiratory effort, chest expands symmetrically. Lungs are clear to auscultation, no crackles or wheezes. Heart:     Normal rate and regular rhythm. S1 and S2 normal without gallop, murmur, click, rub or other extra sounds. Abdomen:  Bowel sounds positive,abdomen soft and non-tender without masses, organomegaly or hernias noted. Extremities:     No clubbing, cyanosis, edema, or deformity noted with normal full range of motion of all joints.   Neurologic:     No cranial nerve deficits noted. Station and gait are normal. Plantar reflexes are down-going bilaterally. DTRs are symmetrical throughout. Sensory, motor and coordinative functions appear intact. She has tenderness over her right sacriliac joint. Neg SLT. Mild paraspinous muscle spasm.    Impression & Recommendations:  Problem # 1:  BACK PAIN, ACUTE (ICD-724.5) Discussed. Suspect sacro-iliac dysfunction. Start Medrol pack, muscle relaxer and back exersize. Gave handout on latter and will give single dose of Toradol for acute pain relief. Councelled ice and activity. if sx not better in 72 hours update, consider xray and PT. Advised need to follow-up here or ED if sx worsen. pt agrees. Her updated medication list for this problem includes:    Tramadol Hcl 50 Mg Tabs (Tramadol hcl) .Marland Kitchen... 1-2 tabs q8hrs    Flexeril 10 Mg Tabs (Cyclobenzaprine hcl) .Marland Kitchen... Three times a day  Orders: Ketorolac-Toradol 15mg  ([id number]) Admin of Therapeutic Inj  intramuscular or subcutaneous (91478)   Medications Added to Medication List  This Visit: 1)  Medrol (pak) 4 Mg Tabs (Methylprednisolone) .... As directed. 2)  Flexeril 10 Mg Tabs (Cyclobenzaprine hcl) .... Three times a day    Medication Administration  Injection # 1:    Medication: Ketorolac-Toradol 15mg     Diagnosis: BACK PAIN, ACUTE (ICD-724.5)    Route: IM    Site: R thigh    Exp Date: 09/16/2008    Lot #: [id number]    Mfr: Abraxis    Patient tolerated injection without complications    Given by: Sherilyn Banker (March 23, 2007 4:15 PM)  Orders Added: 1)  Est. Patient Level III [99213] 2)  Ketorolac-Toradol 15mg  [J1885] 3)  Admin of Therapeutic Inj  intramuscular or subcutaneous [90772]

## 2010-11-16 NOTE — Letter (Signed)
Summary: [facility]  [facility]   Imported By: Curtis Sites 11/23/2007 10:59:43  _____________________________________________________________________  External Attachment:    Type:   Image     Comment:   External Document

## 2010-11-16 NOTE — Letter (Signed)
Summary: [employer] Participant Report  [employer] [facility]   Imported By: Lutricia Horsfall 11/15/2007 15:29:01  _____________________________________________________________________  External Attachment:    Type:   Image     Comment:   External Document

## 2010-11-16 NOTE — Assessment & Plan Note (Signed)
Summary: hospital follow up/arc   Vital Signs:  Patient Profile:   50 Years Old Female Height:     67 inches Weight:      156 pounds BMI:     24.52 O2 Sat:      99 % Pulse rate:   88 / minute Pulse (ortho):   86 / minute Resp:     12 per minute BP sitting:   124 / 80 BP standing:   127 / 85  Vitals Entered By: Sherilyn Banker (September 17, 2008 10:22 AM)                EKG completed per MD order.  Results to MD for review.   Serial Vital Signs/Assessments:  Time      Position  BP       Pulse  Resp  Temp     By 10:25 AM  Lying LA  120/81   90                    Kim French 10:25 AM  Sitting   124/78   88                    Sherilyn Banker 10:25 AM  Standing  127/85   86                    Sherilyn Banker   PCP:  Franchot Heidelberg, MD  Chief Complaint:  Hosp. follow up/ dizzy when she lays down, headaches, and no menstrual pariod since Sept..  History of Present Illness: Pt in for hospital follow-up.  She was seen at [facility] ED on NOv 26 and Nov 28. She states she was very dizzy and saw Dr. Lindwood Coke in the ED. He stated thought she had vertigo and started Dramamine and Valium. She was not allowed to drive. She used this and her symptoms persisted. She then went back and saw Dr. Timmothy Euler in the ED. He ordered head CT and this was normal. Had normal EKG per her report and she was given phenergan and Ultram and she notes has helped her relax some but her symptoms are still there.  She has a headache and states base of skull. She states headache is a 5/10. Worse with nothing and better with nothing. She denies blackputs, tremor and weakness. She states still dizzy. She states happens more when lying down and states also when walking. She states when she gets dizzy the world swirls around her and when she rolls over in bed feels like falling out - husband here and astates actually did. She states feels like world is spinning around her. She has a hx of Vertigo per her report and this cleared  spontaneusly. She has not had watery eyes. She states no ear pain. She denies sore throat. She notes post nasal drip. She deies cough and chest pain. No palitations or leg swelling. She has a good apetite. Some nausea with dizzyness but not as bad. No vomitting. No diarrhea or constipation. NO urinary symptoms. States ED did ua and labs and all was well per report.   She denies ill contacts and she notes travelled to [facility] and came back last Wednesday and this is when symptoms started. States symptoms in past would come on after 8 to 9 hours of driving - never 2.  She now presents.    Current Allergies: ! PENICILLIN ! CEPHALOSPORINS ! * BETALACTAMS ! * CARBAPENEM ! * PENICILLAMINE ! *  SALICYLATES ! * PYRAZOLE ANTIPYRECTICS ! NSAIDS ! * SULFINPYRAZONE ! * OPIOD ANALGESICS ! MORPHINE ! CODEINE     Review of Systems      See HPI   Physical Exam  General:     Pleasant but tired appearing white female on exam table. Nauseated as I move around and she follows me. Well hydrated. Oriented. Head:     Normocephalic and atraumatic without obvious abnormalities. No apparent alopecia or balding. Eyes:     No corneal or conjunctival inflammation noted. EOMI. Perrla. Reports nausea and dizzy feeling with exam. Ears:     External ear exam shows no significant lesions or deformities.  Otoscopic examination reveals clear canals, tympanic membranes are intact bilaterally without bulging, retraction, inflammation or discharge. Hearing is grossly normal bilaterally. Nose:     External nasal examination shows no deformity or inflammation. Nasal mucosa are pink and moist without lesions or exudates. Mouth:     Oral mucosa and oropharynx without lesions or exudates.  Teeth in good repair. Lungs:     Normal respiratory effort, chest expands symmetrically. Lungs are clear to auscultation, no crackles or wheezes. Heart:     Normal rate and regular rhythm. S1 and S2 normal without gallop, murmur,  click, rub or other extra sounds. Abdomen:     Bowel sounds positive,abdomen soft and non-tender without masses, organomegaly or hernias noted. Extremities:     No clubbing, cyanosis, edema, or deformity noted with normal full range of motion of all joints.   Neurologic:     No cranial nerve deficits noted. Station and gait are normal. Plantar reflexes are down-going bilaterally. DTRs are symmetrical throughout. Sensory, motor and coordinative functions appear intact. Psych:     Cognition and judgment appear intact. Alert and cooperative with normal attention span and concentration. No apparent delusions, illusions, hallucinations     Impression & Recommendations:  Problem # 1:  BENIGN POSITIONAL VERTIGO (ICD-386.11) I reviewed her CT from [facility] and the report indicates normal exam. We repeated EKG here and this showed normal SR with HR of 97. UA unremarkable. Her exam and hx is consitant with vertigo. Orthostatics were normal and per her report allmlabs and UA done at [facility] normal as well. I councelled them on what vertigo is and advised Phenergan short term to control nausea. She is to push intake. We will increase Meclizine to one 25 mg OTC tablets every 6 hours. She will start Cawthrone exersizes at home and I instructed her on this and councelled her on frequency at home. If she is not better in 24 hours to call and will refer PT for extingusihing exersize. Agrees. ED eval if symptoms worsen overnight. Her updated medication list for this problem includes:    Meclizine Hcl 25 Mg Tabs (Meclizine hcl) ..... Q8hrs    Promethazine Hcl 25 Mg Supp (Promethazine hcl) ..... One every 6 hours as needed   Problem # 2:  NAUSEA (ICD-787.02) See above. Her updated medication list for this problem includes:    Meclizine Hcl 25 Mg Tabs (Meclizine hcl) ..... Q8hrs    Promethazine Hcl 25 Mg Supp (Promethazine hcl) ..... One every 6 hours as needed   Complete Medication List: 1)  Tramadol Hcl  50 Mg Tabs (Tramadol hcl) .Marland Kitchen.. 1-2 tabs q8hrs 2)  Imitrex 50 Mg Tabs (Sumatriptan succinate) .Marland Kitchen.. 1 tab q2hrs as needed 3)  Meclizine Hcl 25 Mg Tabs (Meclizine hcl) .... Q8hrs 4)  Inderal La 80 Mg Cp24 (Propranolol hcl) .... Once daily 5)  Detrol La 4 Mg Cp24 (Tolterodine tartrate) .... Once daily 6)  Proair Hfa Aers (Albuterol sulfate aers) .... 2 puffs every 6 hours as needed. 7)  Vitamin B-12 100 Mcg Tabs (Cyanocobalamin) .... One by mouth daily 8)  Zoloft 100 Mg Tabs (Sertraline hcl) .... 1/2 by mouth daily 9)  Singulair 10 Mg Tabs (Montelukast sodium) .... One daily 10)  Hydroxyzine Hcl 50 Mg Tabs (Hydroxyzine hcl) .... One by mouth every 6 hours as needed. 11)  Prilosec 20 Mg Cpdr (Omeprazole) .... One daily 12)  Flexeril 10 Mg Tabs (Cyclobenzaprine hcl) .... Three times a day 13)  Promethazine Hcl 25 Mg Supp (Promethazine hcl) .... One every 6 hours as needed  Other Orders: EKG w/ Interpretation (93000)   Patient Instructions: 1)  Please schedule a follow-up appointment in 2 weeks. Pt to call on symptoms afternoon of 09/18/08   Prescriptions: PROMETHAZINE HCL 25 MG SUPP (PROMETHAZINE HCL) One every 6 hours as needed  #60 x 0   Entered and Authorized by:   Franchot Heidelberg MD   Signed by:   Franchot Heidelberg MD on 09/17/2008   Method used:   Print then Give to Patient   RxID:   [id number]  ] Laboratory Results   Urine Tests    Routine Urinalysis   Color: yellow Appearance: Clear Glucose: negative   (Normal Range: Negative) Bilirubin: negative   (Normal Range: Negative) Ketone: trace (5)   (Normal Range: Negative) Spec. Gravity: 1.020   (Normal Range: 1.003-1.035) Blood: trace-intact   (Normal Range: Negative) pH: 5.5   (Normal Range: 5.0-8.0) Protein: trace   (Normal Range: Negative) Urobilinogen: 0.2   (Normal Range: 0-1) Nitrite: negative   (Normal Range: Negative) Leukocyte Esterace: negative   (Normal Range: Negative)        EKG  Procedure  date:  09/17/2008  Findings:      normal:  HR 97  Appended Document: hospital follow up/arc    Clinical Lists Changes  Medications: Changed medication from PROMETHAZINE HCL 25 MG SUPP (PROMETHAZINE HCL) One every 6 hours as needed to PROMETHAZINE HCL 25 MG TABS (PROMETHAZINE HCL) every 6 hrs as needed - Signed Rx of PROMETHAZINE HCL 25 MG TABS (PROMETHAZINE HCL) every 6 hrs as needed;  #60 x 0;  Signed;  Entered by: Sherilyn Banker;  Authorized by: Franchot Heidelberg MD;  Method used: Electronically to [facility]*, [address] [place], [place], [place]  [address], Ph: [phone], Fax: [phone]    Prescriptions: PROMETHAZINE HCL 25 MG TABS (PROMETHAZINE HCL) every 6 hrs as needed  #60 x 0   Entered by:   Sherilyn Banker   Authorized by:   Franchot Heidelberg MD   Signed by:   Sherilyn Banker on 09/18/2008   Method used:   Electronically to        [facility]* (retail)       [address]       [place], [place]  [address]       Ph: [phone]       Fax: [phone]   RxID:   [id number]

## 2010-11-16 NOTE — Progress Notes (Signed)
Summary: 11/21/07 breast US  Phone Note Outgoing Call   Call placed by: Sonny Dandy,  November 22, 2007 8:49 AM Summary of Call: message left for patient to return call .................................................................Marland KitchenMarland KitchenBritt Bottom Trevino  November 22, 2007 8:50 AM  results given to patient .................................................................Marland KitchenMarland KitchenSonny Dandy  November 22, 2007 4:10 PM  Initial call taken by: Sonny Dandy,  November 22, 2007 4:10 PM

## 2010-11-16 NOTE — Assessment & Plan Note (Signed)
Summary: 4 WK F/U   Vital Signs:  Patient Profile:   50 Years Old Female Height:     67 inches Weight:      153 pounds Pulse rate:   100 / minute Resp:     16 per minute BP sitting:   112 / 71  (left arm)  Pt. in pain?   yes    Location:   lower back    Intensity:   7    Type:       sharp  Vitals Entered BySonny Dandy (March 27, 2007 3:15 PM)                PCP:  Franchot Heidelberg, MD  Chief Complaint:  4 week follow up.  History of Present Illness: Pt in for recheck.  She has been struggling in low back.  She rates her pain as improved vs last visit - down from 10/10. Now 7/10. Located lower back - SI joint region. Radiates to right leg. Describes pain as stab - worse with motion. Better with prednisone but muscle relaxer did not help much. States warm water soaking helps quite a bit. She has tried to be active. No fecalor urinary incontinence. Using stool softner as she is worreid about getting cosntipated with Aleve.  She states her schedule is very hectic and she does not think she can get into PT as her mom-in-law is very sick. Curious if we could give her something.  Now presents.  Current Allergies (reviewed today): ! PENICILLIN ! CEPHALOSPORINS ! * BETALACTAMS ! * CARBAPENEM ! * PENICILLAMINE ! * SALICYLATES ! * PYRAZOLE ANTIPYRECTICS ! NSAIDS ! * SULFINPYRAZONE ! * OPIOD ANALGESICS ! MORPHINE ! CODEINE  Past Medical History:    Reviewed history from 12/05/2006 and no changes required:       Allergic rhinitis       Anxiety       Depression       GERD  Past Surgical History:    Reviewed history from 12/05/2006 and no changes required:       Cholecystectomy       Tubal ligation       prolapsed colon       breast cyst       R hand       L leg       4 D & C   Family History:    Reviewed history from 12/05/2006 and no changes required:       father- stomach ca/ htn/ dm/ chf       mother-colon ca/ allergies       grandmother- kidney  failure       3 brothers & 1 sister-htn/ dm       2 children- allergies  Social History:    Reviewed history from 12/05/2006 and no changes required:       Married       lives with spouse and children       Current Smoker       Alcohol use-yes       Drug use-no    Review of Systems      See HPI   Physical Exam  General:     Well-developed,well-nourished,in no acute distress; alert,appropriate and cooperative throughout examination Lungs:     Normal respiratory effort, chest expands symmetrically. Lungs are clear to auscultation, no crackles or wheezes. Heart:     Normal rate and regular rhythm. S1 and S2 normal without gallop,  murmur, click, rub or other extra sounds. Abdomen:     Bowel sounds positive,abdomen soft and non-tender without masses, organomegaly or hernias noted. Extremities:     No clubbing, cyanosis, edema, or deformity noted with normal full range of motion of all joints.   Neurologic:     No cranial nerve deficits noted. Station and gait are normal. Plantar reflexes are down-going bilaterally. DTRs are symmetrical throughout. Sensory, motor and coordinative functions appear intact. She has tenderness over her right sacriliac joint. Neg SLT. Mild paraspinous muscle spasm. Psych:     Cognition and judgment appear intact. Alert and cooperative with normal attention span and concentration. No apparent delusions, illusions, hallucinations    Impression & Recommendations:  Problem # 1:  BACK PAIN, ACUTE (ICD-724.5) Assessment: Improved Discussed. Advised PT - pt not open to this. Will cont Ultram, back exersize and if sx worsen or persist will need PT and xrays. Pt agrees. The following medications were removed from the medication list:    Flexeril 10 Mg Tabs (Cyclobenzaprine hcl) .Marland Kitchen... Three times a day  Her updated medication list for this problem includes:    Tramadol Hcl 50 Mg Tabs (Tramadol hcl) .Marland Kitchen... 1-2 tabs q8hrs   Problem # 2:  GERD  (ICD-530.81) Prilosec as is. Review recent ED paperwork after eval for chest pain and optomize in two weeks. Pt agrees. Her updated medication list for this problem includes:    Prilosec 20 Mg Cpdr (Omeprazole) ..... One daily   Medications Added to Medication List This Visit: 1)  Prilosec 20 Mg Cpdr (Omeprazole) .... One daily   Patient Instructions: 1)  Please schedule a follow-up appointment in 2 weeks. Prescriptions: PRILOSEC 20 MG CPDR (OMEPRAZOLE) One daily  #30 x 3   Entered and Authorized by:   Franchot Heidelberg MD   Signed by:   Franchot Heidelberg MD on 03/27/2007   Method used:   Print then Give to Patient   RxID:   [id number] TRAMADOL HCL 50 MG TABS (TRAMADOL HCL) 1-2 tabs q8hrs  #60 x 0   Entered and Authorized by:   Franchot Heidelberg MD   Signed by:   Franchot Heidelberg MD on 03/27/2007   Method used:   Print then Give to Patient   RxID:   [id number]

## 2010-11-16 NOTE — Letter (Signed)
Summary: Pap results  [facility]  [address]   [place], [place] [address]   Phone: [phone]  Fax: [phone]    02/16/2009  CHIFFON K[facility]TLESON [address] [place], [place]  [address]  Dear [name],  The results of your recent Pap has been reviewed and found:  NORMAL        Sincerely,   Sherilyn Banker LPN

## 2010-11-16 NOTE — Progress Notes (Signed)
Summary: mammo/dexa/colonoscopy referral  Phone Note Outgoing Call   Call placed by: Sonny Dandy,  November 13, 2007 1:00 PM Summary of Call: appointment set for Mammogram [facility], 11/21/07 at 930am, order faxed appointment set for [facility] 11/21/07 at 145pm, order faxed appointment set for Dr Loraine Leriche Jenkins(colonoscopy) 11/27/07 at 245pm, order faxed referral info given to patient   Initial call taken by: Sonny Dandy,  November 13, 2007 1:52 PM

## 2010-11-16 NOTE — Assessment & Plan Note (Signed)
Summary: Chst discomfort   Vital Signs:  Patient Profile:   50 Years Old Female Height:     67 inches Weight:      151 pounds BMI:     23.74 O2 Sat:      100 % Temp:     97.4 degrees F Pulse rate:   93 / minute Resp:     16 per minute BP sitting:   108 / 72  Vitals Entered By: Sherilyn Banker (Mar 06, 2007 1:15 PM)               PCP:  Franchot Heidelberg, MD  Chief Complaint:  Chest Pain.  History of Present Illness: Pt in for acute visit.  She has chest discomfort.  States started last night. Sx behind breast bone and goes to back. She describes an ache that gets to be real sharp. Sx last 2 minutes. Comes and goes. States it began sometime last night. She does get sweaty with it and clammy. She denies radiation to arm and neck. She can exacerbate sx by nothing. She can improve sx by resting and lying down. States somehwat strange as it started while lying down last night.  She was seen on Feb 27, 2007 and was treated for acute asthma exacerbation. She was given medrol dose pack, doycycline and was told to push fluids and get plenty of rest. Adds she stopped the doxy within 24 hrs as she also began having blood in her stool. States this was bright red. Was in bowel of toilet and on paper. She stopped it and sx stopped. She has hx of hemorrhoids in past and has not ever really seeked Rx for this. Apetite is fair. She is nauseated. She denies constipation and notes some loose stools that have been there for several weeks -she thinks a month. Notes hx of IBS and is under a lot of stress. She notes some nasal congestion, notes feels like she has some phelgm in her throat. She has a cough that is productive of clear to bubbly muscous. She adds she has been having terrible heartburn -   She denies fever, chills and sweats Notes she thinks it might just be menopousal for her.  She denies hx of CAD and CVA. No hx of cardiac eval.  She has a hx of ETOH abuse and states she drank two glasses  with three shots last night. She is being evaluated by Mental Health for this. Not sleeping. States Hydroxyzine makes her rest easier but still having trouble. Things a bit better with her "cheating" husband at home. Helping more. She has seen Florencia Reasons and is set to see Dr. Lolly Mustache Mar 13, 2007.  Risk factors for CAD include being female, smoker. She is non-obese and notes fam hx of CAD. No hx of HTN. Denies hx of DM.  Last CBC shows HGB 14.4.   Prior Medications :  TRAMADOL HCL 50 MG TABS (TRAMADOL HCL) 1-2 tabs q8hrs IMITREX 50 MG TABS (SUMATRIPTAN SUCCINATE) 1 tab q2hrs as needed MECLIZINE HCL 25 MG TABS (MECLIZINE HCL) q8hrs INDERAL LA 80 MG CP24 (PROPRANOLOL HCL) once daily DETROL LA 4 MG CP24 (TOLTERODINE TARTRATE) once daily PROAIR HFA  AERS (ALBUTEROL SULFATE AERS) 2 puffs every 6 hours as needed. VITAMIN B-12 100 MCG TABS (CYANOCOBALAMIN) One by mouth daily ZOLOFT 100 MG TABS (SERTRALINE HCL) 1/2 by mouth daily MEDROL (PAK) 4 MG TABS (METHYLPREDNISOLONE) As directed DOXY-CAPS 100 MG CAPS (DOXYCYCLINE HYCLATE) two times a day SINGULAIR 10 MG  TABS (MONTELUKAST SODIUM) One daily HYDROXYZINE HCL 50 MG TABS (HYDROXYZINE HCL) One by mouth every 6 hours as needed.    Current Allergies (reviewed today): No known allergies   Past Medical History:    Reviewed history from 12/05/2006 and no changes required:       Allergic rhinitis       Anxiety       Depression       GERD  Past Surgical History:    Reviewed history from 12/05/2006 and no changes required:       Cholecystectomy       Tubal ligation       prolapsed colon       breast cyst       R hand       L leg       4 D & C   Family History:    Reviewed history from 12/05/2006 and no changes required:       father- stomach ca/ htn/ dm/ chf       mother-colon ca/ allergies       grandmother- kidney failure       3 brothers & 1 sister-htn/ dm       2 children- allergies  Social History:    Reviewed history from  12/05/2006 and no changes required:       Married       lives with spouse and children       Current Smoker       Alcohol use-yes       Drug use-no    Review of Systems      See HPI   Physical Exam  General:     Well-developed,well-nourished,in no acute distress; alert,appropriate and cooperative throughout examination. Appears tired. Lungs:     Normal respiratory effort, chest expands symmetrically. Lungs are clear to auscultation, no crackles or wheezes. Heart:     Normal rate and regular rhythm. S1 and S2 normal without gallop, murmur, click, rub or other extra sounds. Abdomen:     Bowel sounds positive,abdomen soft and slightly tender diffusely, without masses, organomegaly or hernias noted. Rectal:     No external abnormalities noted. Normal sphincter tone. No rectal masses or tenderness. Extremities:     No clubbing, cyanosis, edema, or deformity noted with normal full range of motion of all joints.   Cervical Nodes:     No lymphadenopathy noted Psych:     Cognition and judgment appear intact. Alert and cooperative with normal attention span and concentration. No apparent delusions, illusions, hallucinations    Impression & Recommendations:  Problem # 1:  CHEST PAIN (ICD-786.50) Discussed with patient. Normal EKG noted. Diff includes esophageal spasm vs gastritis and even pancreatitis given steroid Rx with hx of severe GERD and ETOH abuse. Single GI cocktail given and patient reported minimal relief with this. Advised she will need cardiac eval and CXR at [facility] ED to at least look at CXR, cardiac enzymes, amylase, lipase and rule out cardiac cause. She has a hx of GERD and ran out of samples we gave her. Will need to resume this asap as she might also have alcoholic gastritis related to ETOH abuse and depression/anxiety. One should certainly consider work-up such as EGD to eval latter and if she is not admitted to hospital we will arrange for this in near future.  Councelled need to keep appt with Psych and need to keep this to optomize substance abuse and to optomize emotional health. Advised  ambulance transfer and she declines stating she'll just walk over there. Advised risk and benefit and she notes she is well aware but if she could come here she can walk there too. Case discussed with Dr. Melvyn Neth who kindly agrees to accept her in transfer. Note to be faxed. Orders: EKG w/ Interpretation (93000)   Problem # 2:  ASTHMA, WITH ACUTE EXACERBATION (ICD-493.92) Greatly improved. Await ED input. Would DC Prednisone as this could be cause for sx. Cont Proair and Singulair as is. Stop smoking! Her updated medication list for this problem includes:    Proair Hfa Aers (Albuterol sulfate aers) .Marland Kitchen... 2 puffs every 6 hours as needed.    Medrol (pak) 4 Mg Tabs (Methylprednisolone) .Marland Kitchen... As directed    Singulair 10 Mg Tabs (Montelukast sodium) ..... One daily   Problem # 3:  HEMATOCHEZIA (ICD-578.1) Neg heme-occult. HGB down 2 points. Possibly lab variation. Will need GI consult if cardiac cause ruled out. Orders: Hgb (04540) Hemoccult Guaiac-1 spec.(in office) ([id number])   Problem # 4:  ANXIETY (ICD-300.00) Meds as is. Pt requests Xanax for sleep. Advised this would be iull advised given risk for dependance and tolerance as well as high risk for abuse given concomkinant ETOH abuse.  Her updated medication list for this problem includes:    Zoloft 100 Mg Tabs (Sertraline hcl) .Marland Kitchen... 1/2 by mouth daily    Hydroxyzine Hcl 50 Mg Tabs (Hydroxyzine hcl) ..... One by mouth every 6 hours as needed.    Patient Instructions: 1)  Post ED eval as directed under their care.  Laboratory Results  CBC HGB:  12.2 g/dL   (Normal Range: 14.7-82.9 in Males, 12.0-15.0 in Females)   Stool - Occult Blood Hemmoccult #1: negative Date: 03/06/2007  Kit Test Internal QC: Negative   (Normal Range: Negative)     EKG  Procedure date:  03/06/2007  Findings:      normal SR  at 74 BPM

## 2010-11-16 NOTE — Assessment & Plan Note (Signed)
Summary: 2 week follow up/arc   Vital Signs:  Patient Profile:   50 Years Old Female Height:     67 inches Weight:      157 pounds BMI:     24.68 O2 Sat:      100 % Temp:     97.4 degrees F Pulse rate:   99 / minute Resp:     14 per minute BP sitting:   109 / 63  Vitals Entered By: Sherilyn Banker (November 27, 2007 10:45 AM)                 PCP:  Franchot Heidelberg, MD  Chief Complaint:  follow up visit.  History of Present Illness: Pt in for recheck.  She was seen recently for physcial and had nipple discharge. Eval of this revealed fibrocystic breast disease. She had a normal pap-smear and also a normal Dexa.  She is set for GI eval today under the care of Dr. Lovell Sheehan and notes again string family hx of colon cancer.  She was given a script for Chantixt to help her quit smoking. Insurance would not cover and she is "pissed at the" as they were ones who told her to get this. She has not tried anything else. She has never tried the patches and was told by a nuerologist in the past she should avoid these due to her hx of headaches. She has never tried the gum. Notes scared to do due to side-effects. She states she is smoking  1/2 pack per day or less. She wants to try and quit.  She continues with back and neck pain. States a lot better than it was. She has been active around home. States pain now 2/10. She states sharp to dull. Worse with motion, better with rest. Has used Aleve and it helps a lot. Has not had stomach upset with this. She is not doing neck or back exersizes. Curious if we can given handouts.   Now presents.      Current Allergies: ! PENICILLIN ! CEPHALOSPORINS ! * BETALACTAMS ! * CARBAPENEM ! * PENICILLAMINE ! * SALICYLATES ! * PYRAZOLE ANTIPYRECTICS ! NSAIDS ! * SULFINPYRAZONE ! * OPIOD ANALGESICS ! MORPHINE ! CODEINE  Past Medical History:    Reviewed history from 12/05/2006 and no changes required:       Allergic rhinitis       Anxiety  Depression       GERD  Past Surgical History:    Reviewed history from 12/05/2006 and no changes required:       Cholecystectomy       Tubal ligation       prolapsed colon       breast cyst       R hand       L leg       4 D & C   Family History:    Reviewed history from 12/05/2006 and no changes required:       father- stomach ca/ htn/ dm/ chf       mother-colon ca/ allergies       grandmother- kidney failure       3 brothers & 1 sister-htn/ dm       2 children- allergies  Social History:    Reviewed history from 12/05/2006 and no changes required:       Married       lives with spouse and children       Current Smoker  Alcohol use-yes       Drug use-no   Risk Factors: Tobacco use:  current    Year started:  2003    Cigarettes:  Yes -- 1/2 pack(s) per day Drug use:  no Alcohol use:  yes    Type:  Shots of Vodka    Drinks per day:  3  Family History Risk Factors:    Family History of MI in females < 61 years old:  no    Family History of MI in males < 2 years old:  no  Mammogram History:     Date of Last Mammogram:  11/21/2007    Results:  Fibrocytic breast disease   PAP Smear History:     Date of Last PAP Smear:  11/13/2007    Results:  normal    Review of Systems      See HPI  General      Denies chills, fever, and sweats.  Resp      Denies cough, shortness of breath, sputum productive, and wheezing.  GI      Denies abdominal pain, constipation, diarrhea, nausea, and vomiting.  GU      Denies nocturia, urinary frequency, and urinary hesitancy.   Physical Exam  General:     Well-developed,well-nourished,in no acute distress; alert,appropriate and cooperative throughout examination Lungs:     Normal respiratory effort, chest expands symmetrically. Lungs are clear to auscultation, no crackles or wheezes. Heart:     Normal rate and regular rhythm. S1 and S2 normal without gallop, murmur, click, rub or other extra sounds. Abdomen:      Bowel sounds positive,abdomen soft and non-tender without masses, organomegaly or hernias noted. Extremities:     No clubbing, cyanosis, edema, or deformity noted with normal full range of motion of all joints.   Cervical Nodes:     No lymphadenopathy noted Psych:     Cognition and judgment appear intact. Alert and cooperative with normal attention span and concentration. No apparent delusions, illusions, hallucinations    Impression & Recommendations:  Problem # 1:  NECK PAIN (ICD-723.1) Improved. Start neck ROM exersizes. Aleve as needed with food. Monitor for GI upset. Her updated medication list for this problem includes:    Tramadol Hcl 50 Mg Tabs (Tramadol hcl) .Marland Kitchen... 1-2 tabs q8hrs    Flexeril 10 Mg Tabs (Cyclobenzaprine hcl) .Marland Kitchen... Three times a day   Problem # 2:  BACK PAIN, ACUTE (ICD-724.5) See above. Her updated medication list for this problem includes:    Tramadol Hcl 50 Mg Tabs (Tramadol hcl) .Marland Kitchen... 1-2 tabs q8hrs    Flexeril 10 Mg Tabs (Cyclobenzaprine hcl) .Marland Kitchen... Three times a day   Problem # 3:  TOBACCO USER (ICD-305.1) Councelled cessation. Not open to rx. Cut back on own per her choice. Advised improtance of this. Well aware. Her updated medication list for this problem includes:    Chantix 0.5 Mg Tabs (Varenicline tartrate) ..... One daily   Problem # 4:  NIPPLE DISCHARGE (ICD-611.79) Fibrocystic breast disease. Update if any concern.  Problem # 5:  Preventive Health Care (ICD-V70.0) Colonoscopy via Dr. Lovell Sheehan. Note strong fam hx of colon cancer.  Complete Medication List: 1)  Tramadol Hcl 50 Mg Tabs (Tramadol hcl) .Marland Kitchen.. 1-2 tabs q8hrs 2)  Imitrex 50 Mg Tabs (Sumatriptan succinate) .Marland Kitchen.. 1 tab q2hrs as needed 3)  Meclizine Hcl 25 Mg Tabs (Meclizine hcl) .... Q8hrs 4)  Inderal La 80 Mg Cp24 (Propranolol hcl) .... Once daily 5)  Detrol La 4 Mg  Cp24 (Tolterodine tartrate) .... Once daily 6)  Proair Hfa Aers (Albuterol sulfate aers) .... 2 puffs every 6 hours  as needed. 7)  Vitamin B-12 100 Mcg Tabs (Cyanocobalamin) .... One by mouth daily 8)  Zoloft 100 Mg Tabs (Sertraline hcl) .... 1/2 by mouth daily 9)  Singulair 10 Mg Tabs (Montelukast sodium) .... One daily 10)  Hydroxyzine Hcl 50 Mg Tabs (Hydroxyzine hcl) .... One by mouth every 6 hours as needed. 11)  Prilosec 20 Mg Cpdr (Omeprazole) .... One daily 12)  Flexeril 10 Mg Tabs (Cyclobenzaprine hcl) .... Three times a day 13)  Chantix 0.5 Mg Tabs (Varenicline tartrate) .... One daily   Patient Instructions: 1)  Please schedule a follow-up appointment in 3 months - sooner if needed.    ]  Preventive Care Screening  Bone Density:    Date:  11/21/2007    Next Due:  11/2017    Results:  normal std dev  Pap Smear:    Date:  11/13/2007    Next Due:  11/2008    Results:  normal   Mammogram:    Date:  11/21/2007    Next Due:  11/2008    Results:  Fibrocytic breast disease

## 2010-11-16 NOTE — Assessment & Plan Note (Signed)
Summary: physical/arc   Vital Signs:  Patient Profile:   50 Years Old Female Height:     67 inches Weight:      154 pounds Pulse rate:   83 / minute Resp:     16 per minute BP sitting:   117 / 80  (left arm)  Pt. in pain?   yes    Location:   upper back    Intensity:   7    Type:       dull  Vitals Entered BySonny Dandy (November 12, 2007 1:10 PM)              Comments Some flow and spotting today     PCP:  Franchot Heidelberg, MD  Chief Complaint:  PHYSICAL.  History of Present Illness: Pt in for recheck after seveal month abscence.  She notes she fell last Wednesday. States hurts in low and upper back.  She was walking through dark home and tripped over bench. Shwe tried to catch herself and land on right arm. She landed on carpet and did not hit head not black out. She rates her pain as 8/10. Worse with motion. Better with rest. States has used Ibuprofen - 800 mg twice a day. Minimal relief. Denies hx of DDD and trauma except for fall. States has had back problems for years. Notes pain more severe at junction of tailbone and hips. No leg radiculopathy noted. States may have occasionally and seems to be worse on right.   She needs a physical set up as well.  States needs full set of labs due to insurance request from [place].  She would also like to quit smoking.  Last but not least notes hate coming to MD and would like to get everything done asap. Husband with her and echoes this today as well.  Now presents.  Current Allergies (reviewed today): ! PENICILLIN ! CEPHALOSPORINS ! * BETALACTAMS ! * CARBAPENEM ! * PENICILLAMINE ! * SALICYLATES ! * PYRAZOLE ANTIPYRECTICS ! NSAIDS ! * SULFINPYRAZONE ! * OPIOD ANALGESICS ! MORPHINE ! CODEINE  Past Medical History:    Reviewed history from 12/05/2006 and no changes required:       Allergic rhinitis       Anxiety       Depression       GERD  Past Surgical History:    Reviewed history from 12/05/2006 and no  changes required:       Cholecystectomy       Tubal ligation       prolapsed colon       breast cyst       R hand       L leg       4 D & C   Family History:    Reviewed history from 12/05/2006 and no changes required:       father- stomach ca/ htn/ dm/ chf       mother-colon ca/ allergies       grandmother- kidney failure       3 brothers & 1 sister-htn/ dm       2 children- allergies  Social History:    Reviewed history from 12/05/2006 and no changes required:       Married       lives with spouse and children       Current Smoker       Alcohol use-yes       Drug use-no   Risk Factors:  Tobacco use:  current    Year started:  2003    Cigarettes:  Yes -- 1/2 pack(s) per day    Counseled to quit/cut down tobacco use:  yes Drug use:  no Alcohol use:  yes    Type:  Shots of Vodka    Drinks per day:  3   Review of Systems      See HPI  General      Complains of fatigue and malaise.      Pt tired. States choas at home wit remodelling project and pain big contributor.   CV      Denies chest pain or discomfort, swelling of feet, swelling of hands, and weight gain.  Resp      Denies chest discomfort, shortness of breath, sputum productive, and wheezing.  GI      Denies abdominal pain, constipation, diarrhea, nausea, and vomiting.  GU      Denies urinary frequency and urinary hesitancy.      Needs female exam. Has beenw hile since she had this.   Physical Exam  General:     Well-developed,well-nourished,in no acute distress; alert,appropriate and cooperative throughout examination Lungs:     Normal respiratory effort, chest expands symmetrically. Lungs are clear to auscultation, no crackles or wheezes. Heart:     Normal rate and regular rhythm. S1 and S2 normal without gallop, murmur, click, rub or other extra sounds. Abdomen:     Bowel sounds positive,abdomen soft and non-tender without masses, organomegaly or hernias noted. Extremities:     No  clubbing, cyanosis, edema, or deformity noted with normal full range of motion of all joints.   Neurologic:     No cranial nerve deficits noted. Station and gait are normal. Plantar reflexes are down-going bilaterally. DTRs are symmetrical throughout. Sensory, motor and coordinative functions appear intact. She has tenderness over her right sacriliac joint. Neg SLT. Mild paraspinous muscle spasm neck and lower back Cervical Nodes:     No lymphadenopathy noted Psych:     Pleasant. Complains of hurting.    Impression & Recommendations:  Problem # 1:  NECK PAIN (ICD-723.1) Discussed. Get nmeck films given hx of fall. Await result and optomize. Cont Tramadol and add Flexeril for muscle spasm.  Her updated medication list for this problem includes:    Tramadol Hcl 50 Mg Tabs (Tramadol hcl) .Marland Kitchen... 1-2 tabs q8hrs    Flexeril 10 Mg Tabs (Cyclobenzaprine hcl) .Marland Kitchen... Three times a day  Orders: T-DG Cervical Spine Complete (60454)   Problem # 2:  BACK PAIN, ACUTE (ICD-724.5) Single dose Decadron/Depomedrol. Meds as above Get films as some sx suggest sciatica. Await result and optomize. Her updated medication list for this problem includes:    Tramadol Hcl 50 Mg Tabs (Tramadol hcl) .Marland Kitchen... 1-2 tabs q8hrs    Flexeril 10 Mg Tabs (Cyclobenzaprine hcl) .Marland Kitchen... Three times a day  Orders: T-DG Lumbar Spine Complete (09811) Admin of Therapeutic Inj  intramuscular or subcutaneous (91478) Depo- Medrol 80mg  (J1040) Decadron 1mg  (G9562)   Problem # 3:  TOBACCO USER (ICD-305.1) Wants to quit. Questioning Chantix as option as [employer] councellor advised this.  Gave booklet to review. Consider start next visit.  Problem # 4:  Preventive Health Care (ICD-V70.0) Wants well woman labs and exam asap as she does not want to come back to see MD unless necessary. Will draw labs as requested by [facility] screener and patient request with full female exam in am. Agrees. Labs done here today - states just had  greapefruit at  07h00 this am.  Await labs and optomize.  Complete Medication List: 1)  Tramadol Hcl 50 Mg Tabs (Tramadol hcl) .Marland Kitchen.. 1-2 tabs q8hrs 2)  Imitrex 50 Mg Tabs (Sumatriptan succinate) .Marland Kitchen.. 1 tab q2hrs as needed 3)  Meclizine Hcl 25 Mg Tabs (Meclizine hcl) .... Q8hrs 4)  Inderal La 80 Mg Cp24 (Propranolol hcl) .... Once daily 5)  Detrol La 4 Mg Cp24 (Tolterodine tartrate) .... Once daily 6)  Proair Hfa Aers (Albuterol sulfate aers) .... 2 puffs every 6 hours as needed. 7)  Vitamin B-12 100 Mcg Tabs (Cyanocobalamin) .... One by mouth daily 8)  Zoloft 100 Mg Tabs (Sertraline hcl) .... 1/2 by mouth daily 9)  Singulair 10 Mg Tabs (Montelukast sodium) .... One daily 10)  Hydroxyzine Hcl 50 Mg Tabs (Hydroxyzine hcl) .... One by mouth every 6 hours as needed. 11)  Prilosec 20 Mg Cpdr (Omeprazole) .... One daily 12)  Flexeril 10 Mg Tabs (Cyclobenzaprine hcl) .... Three times a day  Other Orders: T-Comprehensive Metabolic Panel [phone]) T-Lipid Profile [phone]) T-CBC w/Diff [phone]) T-TSH [phone]) Venipuncture [phone])   Patient Instructions: 1)  As scheduled in am per her request    Prescriptions: FLEXERIL 10 MG  TABS (CYCLOBENZAPRINE HCL) three times a day  #15 x 0   Entered and Authorized by:   Franchot Heidelberg MD   Signed by:   Franchot Heidelberg MD on 11/12/2007   Method used:   Print then Give to Patient   RxID:   [id number]  ]  Medication Administration  Injection # 1:    Medication: Decadron 1mg     Diagnosis: BACK PAIN, ACUTE (ICD-724.5)    Route: IM    Site: RUOQ gluteus    Exp Date: 07/2009    Lot #: [id number]    Mfr: [employer]    Comments: 4mg  im    Patient tolerated injection without complications    Given by: Sonny Dandy (November 12, 2007 1:42 PM)  Injection # 2:    Medication: Depo- Medrol 80mg     Diagnosis: BACK PAIN, ACUTE (ICD-724.5)    Route: IM    Site: RUOQ gluteus    Exp Date: 09/2008    Lot #: [id number]    Comments: 80mg  IM       Patient tolerated injection without complications    Given by: Sonny Dandy (November 12, 2007 1:43 PM)  Orders Added: 1)  T-Comprehensive Metabolic Panel [[id number]] 2)  T-Lipid Profile [[id number]] 3)  T-CBC w/Diff [[id number]] 4)  T-TSH [[id number]] 5)  T-DG Cervical Spine Complete [72050] 6)  T-DG Lumbar Spine Complete [72110] 7)  Est. Patient Level IV [51761] 8)  Venipuncture [60737] 9)  Admin of Therapeutic Inj  intramuscular or subcutaneous [96372] 10)  Depo- Medrol 80mg  [J1040] 11)  Decadron 1mg  [J1094]

## 2010-11-16 NOTE — Letter (Signed)
Summary: Mammogram results  [facility]  [address]   [place], [place] [address]   Phone: [phone]  Fax: [phone]    02/20/2009  Savannah Trevino [address] [place], [place]  [address]  Dear [name],   The results of your recent Mammogram has been reviewed and found:  Normal          Sincerely,   Sherilyn Banker LPN

## 2010-11-16 NOTE — Assessment & Plan Note (Signed)
Summary: 2 WK F/U   Vital Signs:  Patient Profile:   50 Years Old Female Height:     67 inches Weight:      150 pounds BMI:     23.58 O2 Sat:      99 % Temp:     98.5 degrees F Pulse rate:   102 / minute Resp:     20 per minute BP sitting:   137 / 83               Visit Type:  Recheck PCP:  Franchot Heidelberg, MD  Chief Complaint:  follow up visit/ acid reflux?/ diarrhea.  History of Present Illness: Pt presents today for follow-up.  She notes she is worried about irregular periods.  Cycle seem to come more frequent. She notes she has period twotimes this month. She stopped her Estafem as she was worried about interactions on Zoloft. She has stopped her Citalopram. She did start the Zoloft. She notes the latter seems to be helping better than the Celexa. She feels calmer, she is less irriatble. She feels very anxious still. She has cut back on her ETOH use and states she has not had a drink since Saturday.  Her main stressor is her cheating husband. She notes her husband is still at home and his mom lives them as well. Son was in [place] for 6 months and came for a visit. Mom-in-law got sick and she had to go to hospital. Dx of Laurette Schimke. She had some time with kids and not her husband. Then she got sick. Husband paid attention. She notes she told him she was going to leave him and he noted he needed her to help care for his mom.   No suicidal ideation. No thoughts of harming others. She has been referred to Psych and has an appointment tomorrow. She notes she cancelled it and did not want to fill out all the paperwork. She got freaked out and is not going to see them.  States she has "enough shit going on".  She feels better than she did.  She hates hospitals.  Prior Medications: TRAMADOL HCL 50 MG TABS (TRAMADOL HCL) 1-2 tabs q8hrs IMITREX 50 MG TABS (SUMATRIPTAN SUCCINATE) 1 tab q2hrs as needed MECLIZINE HCL 25 MG TABS (MECLIZINE HCL) q8hrs INDERAL LA 80 MG CP24 (PROPRANOLOL  HCL) once daily DETROL LA 4 MG CP24 (TOLTERODINE TARTRATE) once daily PROAIR HFA  AERS (ALBUTEROL SULFATE AERS) 2 puffs every 6 hours as needed. VITAMIN B-12 100 MCG TABS (CYANOCOBALAMIN) One by mouth daily Current Allergies: No known allergies      Review of Systems      See HPI  General      Complains of fatigue and malaise.  CV      Denies chest pain or discomfort, swelling of feet, swelling of hands, and weight gain.  Resp      Denies chest discomfort, cough, shortness of breath, sputum productive, and wheezing.  GI      Complains of diarrhea, nausea, and vomiting.      Denies abdominal pain and constipation.      Pt sick for three days. Caught Gasto from [employer]. Has hx of GERD. Drinking alright. Has used some phenergan and this helps with immodium  GU      Denies nocturia, urinary frequency, and urinary hesitancy.   Physical Exam  General:     Well-developed,well-nourished,in no acute distress; alert,appropriate and cooperative throughout examination Lungs:     Normal  respiratory effort, chest expands symmetrically. Lungs are clear to auscultation, no crackles or wheezes. Heart:     Normal rate and regular rhythm. S1 and S2 normal without gallop, murmur, click, rub or other extra sounds. Abdomen:     Bowel sounds positive,abdomen soft and non-tender without masses, organomegaly or hernias noted. Extremities:     No clubbing, cyanosis, edema, or deformity noted with normal full range of motion of all joints.   Psych:     Calmer. More reasonable. No agitation. Appears to be sober. Still appears tired.    Impression & Recommendations:  Problem # 1:  DEPRESSION (ICD-311) Reviewed. Sx greatly improved. Increase Zoloft. Pt does not want to see Psych at this time noting they have too much paperwork and she just does not want to complete it. Councelled her on need to call and see if they can work with her. Agrees to contact Dr. Sheela Stack office. Councelled on  dealing with husband, outlined stress relief mechansisms such as exersize, massage and will recheck in 4 weeks - sooner if sx worsen. The following medications were removed from the medication list:    Citalopram Hydrobromide 20 Mg Tabs (Citalopram hydrobromide) ..... Once daily  Her updated medication list for this problem includes:    Zoloft 100 Mg Tabs (Sertraline hcl) ..... One by mouth daily   Problem # 2:  ETHANOL ABUSE (ICD-305.00) Check LFTS. Pt has cut back dramatically since starting Zoloft. Monitor. Councelled again on risk associated. Refer AA - pt not open at present. Orders: T-Comprehensive Metabolic Panel ([id number])   Problem # 3:  TOBACCO USER (ICD-305.1) Councelled on cessation. Not open at present. Cont to advise given risk of COPD, cancer and death.  Problem # 4:  MALAISE AND FATIGUE (ICD-780.79) Labs as below. Suspect depression as cause but tule out anemia, hypothyroidism. Orders: T-CBC w/Diff ([id number]) T-TSH [phone])   Problem # 5:  GASTROENTERITIS (ICD-558.9) Push fluids, rest. Phenergan as needed. May use immodium and Pepto bismol. If sx worsen will need to follow-up and obatin stool studies. Her updated medication list for this problem includes:    Meclizine Hcl 25 Mg Tabs (Meclizine hcl) ..... Q8hrs   Problem # 6:  Preventive Health Care (ICD-V70.0)  Medications Added to Medication List This Visit: 1)  Zoloft 100 Mg Tabs (Sertraline hcl) .... One by mouth daily 2)  Promethazine Hcl 25 Mg Tabs (Promethazine hcl) .... One by mouth every 6 hours as needed.  Prescriptions: PROMETHAZINE HCL 25 MG TABS (PROMETHAZINE HCL) One by mouth every 6 hours as needed.  #30 x 0   Entered and Authorized by:   Franchot Heidelberg MD   Signed by:   Franchot Heidelberg MD on 12/19/2006   Method used:   Print then Give to Patient   RxID:   [id number] MECLIZINE HCL 25 MG TABS (MECLIZINE HCL) q8hrs  #30 x 3   Entered and Authorized by:   Franchot Heidelberg  MD   Signed by:   Franchot Heidelberg MD on 12/19/2006   Method used:   Print then Give to Patient   RxID:   [id number] DETROL LA 4 MG CP24 (TOLTERODINE TARTRATE) once daily  #30 x 3   Entered and Authorized by:   Franchot Heidelberg MD   Signed by:   Franchot Heidelberg MD on 12/19/2006   Method used:   Print then Give to Patient   RxID:   [id number] INDERAL LA 80 MG CP24 (PROPRANOLOL HCL) once daily  #30 x 3   Entered and  Authorized by:   Franchot Heidelberg MD   Signed by:   Franchot Heidelberg MD on 12/19/2006   Method used:   Print then Give to Patient   RxID:   [id number] ZOLOFT 100 MG TABS (SERTRALINE HCL) One by mouth daily  #30 x 3   Entered and Authorized by:   Franchot Heidelberg MD   Signed by:   Franchot Heidelberg MD on 12/19/2006   Method used:   Print then Give to Patient   RxID:   [id number]     EKG  Procedure date:  12/19/2006  Findings:      Sinus bradycardia with rate of:  55

## 2011-03-01 NOTE — H&P (Signed)
NAMEZAYNAH, CHAWLA NO.:  [id number]   MEDICAL RECORD NO.:  [id number]          PATIENT TYPE:  AMB   LOCATION:  DAY                           FACILITY:  [facility]   PHYSICIAN:  Dalia Heading, M.D.  DATE OF BIRTH:  10/09/1961   DATE OF ADMISSION:  DATE OF DISCHARGE:  LH                              HISTORY & PHYSICAL   CHIEF COMPLAINT:  Family history of colon carcinoma, change in bowel  habits.   HISTORY OF PRESENT ILLNESS:  The patient is a 50 year old white female  who is referred for endoscopic evaluation.  She needs a colonoscopy due  to a family history of colon carcinoma and a change in bowel habits.  She has been more constipated recently.  Her father has colon cancer.  No abdominal pain, weight loss, nausea, vomiting, melena, or  hematochezia have been noted.  Her last colonoscopy was in 2006 and was  unremarkable.   PAST MEDICAL HISTORY:  Hand surgery, hypertension, extrinsic allergies,  migraines, reflux disease.   PAST SURGICAL HISTORY:  Hand surgery, knee surgery, cholecystectomy.   CURRENT MEDICATIONS:  Imitrex, meclizine, Inderal, Detrol, albuterol  inhalers, vitamin B12, Singulair, Prilosec, promethazine, Ultram.   ALLERGIES:  PENICILLIN, CEPHALOSPORINS, BETA-LACTAMS, CARBAPENEMS,  SALICYLATES, high resolving antipyretics, nonsteroidal anti-inflammatory  drugs, SULFA, opioid analgesics, CODEINE, MORPHINE.   REVIEW OF SYSTEMS:  The patient denies drinking or smoking.   PHYSICAL EXAMINATION:  GENERAL:  The patient is a well-developed, well-  nourished, white female, in no acute distress.  LUNGS:  Clear to auscultation with equal breath sounds bilaterally.  HEART:  Regular rate and rhythm without S3, S4, murmurs.  ABDOMEN:  Soft, nontender, nondistended.  No hepatosplenomegaly or  masses are noted.  RECTAL:  Deferred to the procedure.   IMPRESSION:  Family history of colon carcinoma, change in bowel habits.   PLAN:  The patient is scheduled  for colonoscopy on Mar 02, 2009.  Risks  and benefits of the procedure including bleeding and perforation were  fully explained to the patient, gave informed consent.      Dalia Heading, M.D.  Electronically Signed     MAJ/MEDQ  D:  02/24/2009  T:  02/25/2009  Job:  [id number]   cc:   Franchot Heidelberg, M.D.

## 2011-03-04 NOTE — H&P (Signed)
NAMEJONA, ERKKILA NO.:  [id number]   MEDICAL RECORD NO.:  [id number]          PATIENT TYPE:  AMB   LOCATION:  DAY                           FACILITY:  [facility]   PHYSICIAN:  Dalia Heading, M.D.  DATE OF BIRTH:  [birth year]   DATE OF ADMISSION:  DATE OF DISCHARGE:  LH                                HISTORY & PHYSICAL   CHIEF COMPLAINT:  Diarrhea, right-sided abdominal pain.   HISTORY OF PRESENT ILLNESS:  The patient is a 50 year old white female who  is referred for endoscopic evaluation.  She had colonoscopy for diarrhea and  right lower quadrant abdominal pain.  Both have been present for several  months after starting taking antibiotics.  She has been told by Dr. Despina Hidden  that she needs a TAH/BSO.  No weight loss, nausea, vomiting, constipation,  melena, hematochezia have been noted.  She last had a colonoscopy in 2002  which revealed a rectal polyp which was negative for malignancy.  There is  no family history of colon carcinoma.   PAST MEDICAL HISTORY:  1.  Hand surgery.  2.  Knee surgery.  3.  Cholecystectomy.   CURRENT MEDICATIONS:  1.  Vicodin for pain.  2.  Multiple other medications.   ALLERGIES:  1.  MORPHINE.  2.  ASPIRIN.  3.  PENICILLIN.  4.  ERYTHROMYCIN.  5.  HYDROCODONE.   SOCIAL HISTORY:  The patient denies drinking or smoking.   REVIEW OF SYSTEMS:  She denies any other cardiopulmonary difficulties or  bleeding disorders.   PHYSICAL EXAMINATION:  GENERAL:  The patient is a well-developed, well-  nourished, anxious white female.  LUNGS:  Clear to auscultation with equal breath sounds bilaterally.  HEART:  Reveals a regular rate and rhythm without S3, S4, or murmurs.  ABDOMEN:  Soft, nontender, nondistended.  No hepatosplenomegaly or masses  are noted.  RECTAL:  Examination deferred to the procedure.   IMPRESSION:  Diarrhea, abdominal pain.   PLAN:  The patient is scheduled for a colonoscopy on July 05, 2005.  The risks and  benefits of the procedure including bleeding and perforation  were fully explained to the patient, who gave informed consent.  She will  also be undergoing a CT scan of the abdomen.      Dalia Heading, M.D.  Electronically Signed     MAJ/MEDQ  D:  06/30/2005  T:  06/30/2005  Job:  [id number]   cc:   Dalia Heading, M.D.  [address], [address]  [place]  [place] [address]  Fax: [phone]   Lazaro Arms, M.D.  [address]  [place]  [place] [address]  Fax: [phone]

## 2013-03-04 ENCOUNTER — Other Ambulatory Visit (HOSPITAL_COMMUNITY): Payer: Self-pay | Admitting: *Deleted

## 2013-03-04 DIAGNOSIS — Z139 Encounter for screening, unspecified: Secondary | ICD-10-CM

## 2013-03-07 ENCOUNTER — Ambulatory Visit (HOSPITAL_COMMUNITY)
Admission: RE | Admit: 2013-03-07 | Discharge: 2013-03-07 | Disposition: A | Discharge status: Home / Self Care | Payer: 59 | Source: Ambulatory Visit | Attending: *Deleted | Admitting: *Deleted

## 2013-03-07 DIAGNOSIS — Z139 Encounter for screening, unspecified: Secondary | ICD-10-CM

## 2013-03-07 DIAGNOSIS — Z1231 Encounter for screening mammogram for malignant neoplasm of breast: Secondary | ICD-10-CM | POA: Insufficient documentation

## 2016-02-26 LAB — BASIC METABOLIC PANEL: Glucose: 83 mg/dL

## 2016-10-15 ENCOUNTER — Ambulatory Visit (INDEPENDENT_AMBULATORY_CARE_PROVIDER_SITE_OTHER): Payer: BLUE CROSS/BLUE SHIELD | Admitting: Family Medicine

## 2016-10-15 ENCOUNTER — Emergency Department (HOSPITAL_COMMUNITY): Payer: BLUE CROSS/BLUE SHIELD

## 2016-10-15 ENCOUNTER — Observation Stay (HOSPITAL_COMMUNITY)
Admission: EM | Admit: 2016-10-15 | Discharge: 2016-10-16 | Disposition: A | Discharge status: Home / Self Care | Payer: BLUE CROSS/BLUE SHIELD | Attending: Internal Medicine | Admitting: Internal Medicine

## 2016-10-15 VITALS — BP 130/80 | HR 93 | Temp 97.6°F | Resp 16 | Ht 67.0 in | Wt 154.6 lb

## 2016-10-15 DIAGNOSIS — R29898 Other symptoms and signs involving the musculoskeletal system: Secondary | ICD-10-CM | POA: Diagnosis not present

## 2016-10-15 DIAGNOSIS — M25512 Pain in left shoulder: Secondary | ICD-10-CM | POA: Insufficient documentation

## 2016-10-15 DIAGNOSIS — R270 Ataxia, unspecified: Secondary | ICD-10-CM

## 2016-10-15 DIAGNOSIS — G43109 Migraine with aura, not intractable, without status migrainosus: Secondary | ICD-10-CM

## 2016-10-15 DIAGNOSIS — Z885 Allergy status to narcotic agent status: Secondary | ICD-10-CM | POA: Diagnosis not present

## 2016-10-15 DIAGNOSIS — G459 Transient cerebral ischemic attack, unspecified: Principal | ICD-10-CM | POA: Diagnosis present

## 2016-10-15 DIAGNOSIS — J45909 Unspecified asthma, uncomplicated: Secondary | ICD-10-CM | POA: Diagnosis not present

## 2016-10-15 DIAGNOSIS — Z888 Allergy status to other drugs, medicaments and biological substances status: Secondary | ICD-10-CM | POA: Diagnosis not present

## 2016-10-15 DIAGNOSIS — M79602 Pain in left arm: Secondary | ICD-10-CM | POA: Diagnosis not present

## 2016-10-15 DIAGNOSIS — R404 Transient alteration of awareness: Secondary | ICD-10-CM

## 2016-10-15 DIAGNOSIS — R110 Nausea: Secondary | ICD-10-CM

## 2016-10-15 DIAGNOSIS — F410 Panic disorder [episodic paroxysmal anxiety] without agoraphobia: Secondary | ICD-10-CM | POA: Insufficient documentation

## 2016-10-15 DIAGNOSIS — Z880 Allergy status to penicillin: Secondary | ICD-10-CM | POA: Insufficient documentation

## 2016-10-15 DIAGNOSIS — Z7982 Long term (current) use of aspirin: Secondary | ICD-10-CM | POA: Insufficient documentation

## 2016-10-15 DIAGNOSIS — R42 Dizziness and giddiness: Secondary | ICD-10-CM

## 2016-10-15 DIAGNOSIS — R4189 Other symptoms and signs involving cognitive functions and awareness: Secondary | ICD-10-CM | POA: Diagnosis not present

## 2016-10-15 DIAGNOSIS — Z8249 Family history of ischemic heart disease and other diseases of the circulatory system: Secondary | ICD-10-CM

## 2016-10-15 DIAGNOSIS — G250 Essential tremor: Secondary | ICD-10-CM | POA: Diagnosis not present

## 2016-10-15 DIAGNOSIS — R52 Pain, unspecified: Secondary | ICD-10-CM

## 2016-10-15 DIAGNOSIS — G5602 Carpal tunnel syndrome, left upper limb: Secondary | ICD-10-CM

## 2016-10-15 LAB — COMPREHENSIVE METABOLIC PANEL
ALT: 39 U/L (ref 14–54)
AST: 45 U/L — ABNORMAL HIGH (ref 15–41)
Albumin: 4 g/dL (ref 3.5–5.0)
Alkaline Phosphatase: 61 U/L (ref 38–126)
Anion gap: 9 (ref 5–15)
BUN: 10 mg/dL (ref 6–20)
CO2: 25 mmol/L (ref 22–32)
Calcium: 9.4 mg/dL (ref 8.9–10.3)
Chloride: 106 mmol/L (ref 101–111)
Creatinine, Ser: 0.78 mg/dL (ref 0.44–1.00)
GFR calc Af Amer: >60 mL/min (ref >60)
GFR calc non Af Amer: >60 mL/min (ref >60)
Glucose, Bld: 88 mg/dL (ref 65–99)
Potassium: 3.6 mmol/L (ref 3.5–5.1)
Sodium: 140 mmol/L (ref 135–145)
Total Bilirubin: 0.8 mg/dL (ref 0.3–1.2)
Total Protein: 7.2 g/dL (ref 6.5–8.1)

## 2016-10-15 LAB — I-STAT CHEM 8, ED
BUN: 11 mg/dL (ref 6–20)
Calcium, Ion: 1.11 mmol/L — ABNORMAL LOW (ref 1.15–1.40)
Chloride: 104 mmol/L (ref 101–111)
Creatinine, Ser: 0.7 mg/dL (ref 0.44–1.00)
Glucose, Bld: 87 mg/dL (ref 65–99)
HCT: 41 % (ref 36.0–46.0)
Hemoglobin: 13.9 g/dL (ref 12.0–15.0)
Potassium: 3.5 mmol/L (ref 3.5–5.1)
Sodium: 142 mmol/L (ref 135–145)
TCO2: 25 mmol/L (ref 0–100)

## 2016-10-15 LAB — URINALYSIS, ROUTINE W REFLEX MICROSCOPIC
Bilirubin Urine: NEGATIVE
Glucose, UA: NEGATIVE mg/dL
Hgb urine dipstick: NEGATIVE
Ketones, ur: NEGATIVE mg/dL
Leukocytes, UA: NEGATIVE
Nitrite: NEGATIVE
Protein, ur: NEGATIVE mg/dL
Specific Gravity, Urine: 1.003 — ABNORMAL LOW (ref 1.005–1.030)
pH: 7 (ref 5.0–8.0)

## 2016-10-15 LAB — GLUCOSE, POCT (MANUAL RESULT ENTRY): POC Glucose: 103 mg/dl — AB (ref 70–99)

## 2016-10-15 LAB — CBC
HCT: 41.2 % (ref 36.0–46.0)
Hemoglobin: 13.8 g/dL (ref 12.0–15.0)
MCH: 28.9 pg (ref 26.0–34.0)
MCHC: 33.5 g/dL (ref 30.0–36.0)
MCV: 86.2 fL (ref 78.0–100.0)
Platelets: 194 10*3/uL (ref 150–400)
RBC: 4.78 MIL/uL (ref 3.87–5.11)
RDW: 12.7 % (ref 11.5–15.5)
WBC: 6.9 10*3/uL (ref 4.0–10.5)

## 2016-10-15 LAB — RAPID URINE DRUG SCREEN, HOSP PERFORMED
Amphetamines: NOT DETECTED
Barbiturates: NOT DETECTED
Benzodiazepines: NOT DETECTED
Cocaine: NOT DETECTED
Opiates: NOT DETECTED
Tetrahydrocannabinol: NOT DETECTED

## 2016-10-15 LAB — DIFFERENTIAL
Basophils Absolute: 0 10*3/uL (ref 0.0–0.1)
Basophils Relative: 0 %
Eosinophils Absolute: 0.1 10*3/uL (ref 0.0–0.7)
Eosinophils Relative: 1 %
Lymphocytes Relative: 37 %
Lymphs Abs: 2.6 10*3/uL (ref 0.7–4.0)
Monocytes Absolute: 0.6 10*3/uL (ref 0.1–1.0)
Monocytes Relative: 9 %
Neutro Abs: 3.7 10*3/uL (ref 1.7–7.7)
Neutrophils Relative %: 53 %

## 2016-10-15 LAB — ETHANOL: Alcohol, Ethyl (B): <5 mg/dL (ref <5)

## 2016-10-15 LAB — PROTIME-INR
INR: 0.89
Prothrombin Time: 12 seconds (ref 11.4–15.2)

## 2016-10-15 LAB — I-STAT TROPONIN, ED: Troponin i, poc: 0 ng/mL (ref 0.00–0.08)

## 2016-10-15 LAB — AMMONIA: Ammonia: 26 umol/L (ref 9–35)

## 2016-10-15 LAB — APTT: aPTT: 29 seconds (ref 24–36)

## 2016-10-15 MED ORDER — ACETAMINOPHEN 325 MG PO TABS
650.0000 mg | ORAL_TABLET | ORAL | Status: DC | PRN
  Administered 2016-10-15 – 2016-10-16 (×2): 650 mg via ORAL
  Filled 2016-10-15 (×2): qty 2

## 2016-10-15 MED ORDER — ENOXAPARIN SODIUM 40 MG/0.4ML ~~LOC~~ SOLN
40.0000 mg | SUBCUTANEOUS | Status: DC
  Filled 2016-10-15: qty 0.4

## 2016-10-15 MED ORDER — ONDANSETRON 4 MG PO TBDP
4.0000 mg | ORAL_TABLET | Freq: Once | ORAL | Status: AC
  Administered 2016-10-15: 4 mg via ORAL

## 2016-10-15 MED ORDER — ACETAMINOPHEN 160 MG/5ML PO SOLN: 650.0000 mg | ORAL | Status: DC | PRN

## 2016-10-15 MED ORDER — ACETAMINOPHEN 650 MG RE SUPP: 650.0000 mg | RECTAL | Status: DC | PRN

## 2016-10-15 MED ORDER — ONDANSETRON HCL 4 MG/2ML IJ SOLN
4.0000 mg | Freq: Three times a day (TID) | INTRAMUSCULAR | Status: DC | PRN
  Administered 2016-10-15: 4 mg via INTRAVENOUS
  Filled 2016-10-15: qty 2

## 2016-10-15 MED ORDER — ASPIRIN 300 MG RE SUPP: 300.0000 mg | Freq: Every day | RECTAL | Status: DC

## 2016-10-15 MED ORDER — ASPIRIN 325 MG PO TABS
325.0000 mg | ORAL_TABLET | Freq: Every day | ORAL | Status: DC
  Filled 2016-10-15: qty 1

## 2016-10-15 MED ORDER — ASPIRIN 81 MG PO CHEW
324.0000 mg | CHEWABLE_TABLET | Freq: Once | ORAL | Status: AC
  Administered 2016-10-15: 324 mg via ORAL

## 2016-10-15 MED ORDER — STROKE: EARLY STAGES OF RECOVERY BOOK
Freq: Once | Status: AC
  Administered 2016-10-15: 1
  Filled 2016-10-15: qty 1

## 2016-10-15 MED ORDER — ALBUTEROL SULFATE (2.5 MG/3ML) 0.083% IN NEBU: 2.5000 mg | INHALATION_SOLUTION | Freq: Every day | RESPIRATORY_TRACT | Status: DC | PRN

## 2016-10-15 MED ORDER — SENNOSIDES-DOCUSATE SODIUM 8.6-50 MG PO TABS
1.0000 | ORAL_TABLET | Freq: Every evening | ORAL | Status: DC | PRN
  Filled 2016-10-15: qty 1

## 2016-10-15 NOTE — Progress Notes (Signed)
Received from ED via stretcher; patient walked from stretcher to the bed; oriented to room and unit routine;family at bedside.

## 2016-10-15 NOTE — ED Notes (Addendum)
CBG 81.  

## 2016-10-15 NOTE — ED Provider Notes (Signed)
[facility]-EMERGENCY DEPT Provider Note   CSN: [id number] Arrival date & time: 10/15/16  1457   An emergency department physician performed an initial assessment on this suspected stroke patient at 1451.  History   Chief Complaint Chief Complaint  Patient presents with  . Code Stroke    HPI Savannah Trevino is a 55 y.o. female.  HPI\  Patient is a 55 year old female presenting with off and on symptoms for the last 2 weeks. She was at church today when she went confided in her Friend Who Is a Engineer, civil (consulting)urse. She Reports That for the Last 2 Weeks She Occasionally Has Had Left Arm Pain. Occasionally Tingling in Her Left finger tips and Fatigue.   Reportedly she called her PCP and they were unable to see her until next week.  Patient was instructed to go to urgent care. She went to urgent care had a normal EKG, and then started to have feelings of fatigue and "speech changes". On arrival to the emergency department for code stroke patient had clear speech.    No past medical history on file.  There are no active problems to display for this patient.   No past surgical history on file.  OB History    No data available       Home Medications    Prior to Admission medications   Not on File    Family History No family history on file.  Social History Social History  Substance Use Topics  . Smoking status: Not on file  . Smokeless tobacco: Not on file  . Alcohol use Not on file     Allergies   Hydrocodone and Morphine and related   Review of Systems Review of Systems  Constitutional: Positive for fatigue. Negative for fever.  Respiratory: Negative for chest tightness and shortness of breath.   Cardiovascular: Negative for chest pain.  Gastrointestinal: Positive for nausea. Negative for abdominal pain.  Neurological: Positive for dizziness, weakness and light-headedness.  All other systems reviewed and are negative.    Physical Exam Updated Vital Signs Ht 5\' 7"   (1.702 m)   Wt 153 lb 3.5 oz (69.5 kg)   BMI 24.00 kg/m   Physical Exam  Constitutional: She is oriented to person, place, and time. She appears well-developed and well-nourished.  HENT:  Head: Normocephalic and atraumatic.  Eyes: Right eye exhibits no discharge.  Cardiovascular: Normal rate, regular rhythm and normal heart sounds.   No murmur heard. Pulmonary/Chest: Effort normal and breath sounds normal. She has no wheezes. She has no rales.  Abdominal: Soft. She exhibits no distension. There is no tenderness.  Neurological: She is oriented to person, place, and time.  Equal strength bilaterally upper and lower extremities negative pronator drift. Normal sensation bilaterally. Speech comprehensible, no slurring. Facial nerve tested and appears grossly normal. Alert and oriented 3.   Patient has weakness in strength in bilateral upper extremities? Related to effort. No pronator drift. Normal sensation.  Skin: Skin is warm and dry. She is not diaphoretic.  Psychiatric: She has a normal mood and affect.  Nursing note and vitals reviewed.    ED Treatments / Results  Labs (all labs ordered are listed, but only abnormal results are displayed) Labs Reviewed  I-STAT CHEM 8, ED - Abnormal; Notable for the following:       Result Value   Calcium, Ion 1.11 (*)    All other components within normal limits  CBC  DIFFERENTIAL  ETHANOL  PROTIME-INR  APTT  COMPREHENSIVE METABOLIC PANEL  RAPID URINE DRUG SCREEN, HOSP PERFORMED  URINALYSIS, ROUTINE W REFLEX MICROSCOPIC  I-STAT TROPOININ, ED    EKG  EKG Interpretation None       Radiology Ct Head Code Stroke W/o Cm  Result Date: 10/15/2016 CLINICAL DATA:  Code stroke. Left hand weakness and tingling. Last seen normal 2.5 hours ago. EXAM: CT HEAD WITHOUT CONTRAST TECHNIQUE: Contiguous axial images were obtained from the base of the skull through the vertex without intravenous contrast. COMPARISON:  None. FINDINGS: Brain: Normal  appearance without evidence of atrophy, old or acute infarction, mass lesion, hemorrhage, hydrocephalus or extra-axial collection. Vascular: No calcified or hyperdense vessels. Skull: Normal Sinuses/Orbits: Clear/ normal Other: None ASPECTS (Alberta Stroke Program Early CT Score) - Ganglionic level infarction (caudate, lentiform nuclei, internal capsule, insula, M1-M3 cortex): 7 - Supraganglionic infarction (M4-M6 cortex): 3 Total score (0-10 with 10 being normal): 10 IMPRESSION: 1. Normal head CT 2. ASPECTS is 10 Page placed by telephone at the time of interpretation on 10/15/2016 at 3:07 pm to Dr. Otelia LimesLindzen . Electronically Signed   By: Paulina FusiMark  Shogry M.D.   On: 10/15/2016 15:09    Procedures Procedures (including critical care time)  Medications Ordered in ED Medications - No data to display   Initial Impression / Assessment and Plan / ED Course  I have reviewed the triage vital signs and the nursing notes.  Pertinent labs & imaging results that were available during my care of the patient were reviewed by me and considered in my medical decision making (see chart for details).  Clinical Course    Patient is a 55 year old female presenting as code stroke. Patient's had off-and-on symptoms for the last 2 weeks. The symptoms are mainly in her left arm and associated with pain, numbness in her fingertips. She had questionable speech difficulties today. However none noted on arrival here to the emergency department. Patient seen by neuro and does not eligible for TPA at this time. We will continue to keep our workup broad  By getting an EKG, serial troponins, and then MR of the head and neck as an inpatient. This likely represents radiculopathy radiating to the left arm. However since we were not present for the speech changes, will admit for TIA rule out.       Final Clinical Impressions(s) / ED Diagnoses   Final diagnoses:  None    New Prescriptions New Prescriptions   No medications on  file     Courteney Randall AnLyn Mackuen, MD 10/16/16 2336

## 2016-10-15 NOTE — Consult Note (Signed)
Referring Physician: Dr. Corlis LeakMacKuen    Chief Complaint: Left arm pain and AMS  HPI: Savannah Trevino is an 55 y.o. female who [facility] for evaluation of acutely altered mentation. Notes from [facility] encounter [facility] was reviewed:  "Savannah Trevino is a 55 y.o. female who [facility] complaining of left arm pain in her deltoid region. Patient was brought back emergently and we were called to Trauma Rm 6. Patient states she's been having spells of feeling flushed, and "feeling like going to pass out" for the past 2 weeks. Her left hand has been becoming numb and "feeling tingly". Her spells would lasts for 2~3 minutes and would have some relief after sitting still. She hasn't been able to sleep, and every morning, her arm has throbbing pain with fingers still asleep. She has these episodes both out shopping at the mall and also at rest. She states she works all the time, and is generally active, but no formal exercise. She is right hand dominant.   She notes tingling and weakness through her left arm. She has weakness in her left hand, present in office. She mentions that her legs also feel like they're about to give way. She also complains of nausea, especially worse during these episodes. When she lays down, she complains it makes her want to vomit. She denies chest pain, chest pressure, shortness of breath, syncope, urinary or bowel symptoms. She has had panic attacks in the past, but notes it was a long time ago.   [place]. She tried calling them regarding her arm, but was informed to follow up in February. She has been on propanolol 40mg  bid for a long time, and singulair. 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 She's had history of vertigo for a few times."   The above history essentially agrees with information related to me by the patient and her friend in the ED. Her friend, who is a Engineer, civil (consulting)nurse, states that the patient's speech output is not at her  baseline, but does not seem to be aphasic. During bedside evaluation, the patient's speech output is sparse, with little spontaneity, but without errors of syntax or grammar, with no paraphasias or word finding difficulty, normal repetition and intact comprehension for all commands and questions.   LSN: 12:30 PM tPA Given: No: Due to no clear localizing findings on examination. Speech alteration not consistent with a focal cortical lesion.  No past medical history on file.  No past surgical history on file.  No family history on file. Social History:  has no tobacco, alcohol, and drug history on file.  Allergies:  Allergies  Allergen Reactions  . Hydrocodone   . Morphine And Related     Medications:  Prior to Admission:  Prescriptions Prior to Admission  Medication Sig Dispense Refill Last Dose  . Cyanocobalamin (VITAMIN B-12 SL) Place 2 tablets under the tongue once.   10/15/2016 at Unknown time  . montelukast (SINGULAIR) 10 MG tablet Take 10 mg by mouth at bedtime.  1 10/14/2016 at Unknown time  . PROAIR HFA 108 (90 Base) MCG/ACT inhaler Inhale 2 puffs into the lungs daily as needed for shortness of breath.  4 Past Month at Unknown time  . propranolol (INDERAL) 40 MG tablet Take 40 mg by mouth 2 (two) times daily.  1 10/14/2016 at Unknown time    ROS: Positive for headache and nausea. No neck pain, chest pain or SOB.   Physical Examination:  Height 5\' 7"  (1.702 m), weight 69.5 kg (153 lb 3.5 oz).  HEENT: [place]/AT Lungs: Respirations unlabored. No gross wheezing.  Extremities: Warm and well-perfused.   Neurologic Examination: Ment: The patient's speech output is sparse, with little spontaneity, but without errors of syntax or grammar. There are no paraphasias and no word finding difficulty. Repetition is normal. Intact comprehension for all commands and questions.  CN: PERRL. Visual fields intact. EOMI intact without nystagmus. Facial sensation intact. VII symmetric. Hearing  intact to questions and commands. No hoarseness noted. XI symmetric. Tongue protrudes midline.  Motor: 5/5 x 4 except for intermittent giveway weakness of LUE during testing of deltoid, triceps and biceps strength that appears functional Sensory: Subjective alteration of sensation to LUE. Intermittent TTP of left triceps.  Reflexes: Normoactive x 4 Cerebellar: No ataxia with FNF. Intermittent tremor noted bilaterally.  Gait: Deferred during acute stroke evaluation   Results for orders placed or performed during the hospital encounter of 10/15/16 (from the past 48 hour(s))  I-stat troponin, ED (not at [facility], [facility])     Status: None   Collection Time: 10/15/16  3:00 PM  Result Value Ref Range   Troponin i, poc 0.00 0.00 - 0.08 ng/mL   Comment 3            Comment: Due to the release kinetics of cTnI, a negative result within the first hours of the onset of symptoms does not rule out myocardial infarction with certainty. If myocardial infarction is still suspected, repeat the test at appropriate intervals.   I-Stat Chem 8, ED  (not at [facility], [facility])     Status: Abnormal   Collection Time: 10/15/16  3:02 PM  Result Value Ref Range   Sodium 142 135 - 145 mmol/L   Potassium 3.5 3.5 - 5.1 mmol/L   Chloride 104 101 - 111 mmol/L   BUN 11 6 - 20 mg/dL   Creatinine, Ser 8.650.70 0.44 - 1.00 mg/dL   Glucose, Bld 87 65 - 99 mg/dL   Calcium, Ion 7.841.11 (L) 1.15 - 1.40 mmol/L   TCO2 25 0 - 100 mmol/L   Hemoglobin 13.9 12.0 - 15.0 g/dL   HCT 69.641.0 29.536.0 - 28.446.0 %  CBC     Status: None   Collection Time: 10/15/16  3:09 PM  Result Value Ref Range   WBC 6.9 4.0 - 10.5 K/uL   RBC 4.78 3.87 - 5.11 MIL/uL   Hemoglobin 13.8 12.0 - 15.0 g/dL   HCT 13.241.2 44.036.0 - 10.246.0 %   MCV 86.2 78.0 - 100.0 fL   MCH 28.9 26.0 - 34.0 pg   MCHC 33.5 30.0 - 36.0 g/dL   RDW 72.512.7 36.611.5 - 44.015.5 %   Platelets 194 150 - 400 K/uL  Differential     Status: None   Collection Time: 10/15/16  3:09 PM  Result Value Ref Range   Neutrophils  Relative % 53 %   Neutro Abs 3.7 1.7 - 7.7 K/uL   Lymphocytes Relative 37 %   Lymphs Abs 2.6 0.7 - 4.0 K/uL   Monocytes Relative 9 %   Monocytes Absolute 0.6 0.1 - 1.0 K/uL   Eosinophils Relative 1 %   Eosinophils Absolute 0.1 0.0 - 0.7 K/uL   Basophils Relative 0 %   Basophils Absolute 0.0 0.0 - 0.1 K/uL   Ct Head Code Stroke W/o Cm  Result Date: 10/15/2016 CLINICAL DATA:  Code stroke. Left hand weakness and tingling. Last seen normal 2.5 hours ago. EXAM: CT HEAD WITHOUT  CONTRAST TECHNIQUE: Contiguous axial images were obtained from the base of the skull through the vertex without intravenous contrast. COMPARISON:  None. FINDINGS: Brain: Normal appearance without evidence of atrophy, old or acute infarction, mass lesion, hemorrhage, hydrocephalus or extra-axial collection. Vascular: No calcified or hyperdense vessels. Skull: Normal Sinuses/Orbits: Clear/ normal Other: None ASPECTS (Alberta Stroke Program Early CT Score) - Ganglionic level infarction (caudate, lentiform nuclei, internal capsule, insula, M1-M3 cortex): 7 - Supraganglionic infarction (M4-M6 cortex): 3 Total score (0-10 with 10 being normal): 10 IMPRESSION: 1. Normal head CT 2. ASPECTS is 10 Page placed by telephone at the time of interpretation on 10/15/2016 at 3:07 pm to Dr. Otelia Limes . Electronically Signed   By: Paulina Fusi M.D.   On: 10/15/2016 15:09    Assessment: 55 y.o. female with acute onset of multiple somatic and neurological complaints.  1. Exam shows no consistent localizable deficit. LUE giveway weakness and bilateral upper extremity tremor appear most consistent with functional disorder (conversion disorder, factitious disorder or secondary gain). Stroke is possible but unlikely. Based upon exam findings, including mild/transient deficits, administration of tPA would present risks significantly greater than potential benefits. Patient stated that she did not want to be considered for IV tPA administration.  2.  Additonal radiation exposure and risks of IV contrast felt to outweigh benefits. Discussed with patient who expressed understanding and agreement with the plan.  3. Left shoulder pain and paresthesias. DDx includes apical left lung mass, radiculopathy and plexopathy. 4. Subjective hypotension.  5. Transient confusion.   Plan: 1. Orthostatics and tilt-table testing.  2. MRI brain. 3. MRI cervical spine with and without contrast. 4. Chest x-ray to rule out apical left lung mass (could result in left shoulder pain and paresthesias). 5. EEG.  6. Telemetry to evaluate for possible arrhythmia. 7. Consider psychology consult to evaluate for possible recent life stressors.  8. If above are unrevealing, schedule patient for outpatient Neurology follow up.   @Electronically  signed: Dr. Caryl Pina  10/15/2016, 3:34 PM

## 2016-10-15 NOTE — ED Triage Notes (Addendum)
Per EMS - pt was at church this morning. Pt began to "feel bad." Pt reports feeling flushed all over with nausea, left arm pain and left hand numbness/tingling. Pt reports intermittent episodes of same x 2-3 weeks. LSN 1230 today. Pt given 4mg  zofran w/ some relief of symptoms. Pt states she has not eaten all day. Pt reporting intermittent episodes confusion and difficulty getting words out.

## 2016-10-15 NOTE — H&P (Signed)
History and Physical    Savannah GripCorleah Trevino ZHY:[id number] DOB: 07/17/1961 DOA: 10/15/2016  PCP: No PCP Per Patient  Outpatient Specialists: none Patient coming from: home  Chief Complaint: left arm numbness, changes in speech  HPI: Savannah Trevino is a 55 y.o. female without much significant medical history, other than migraine headaches which have resolved since her divorce, presents to the emergency room with complaints of left arm pain, left shoulder pain as well as left arm numbness. She is also complaining of intermittent speech problems and she feels "confused" at times. This constellation of symptoms she labels as "spells" and she has noticed that they come and go and they have been doing so over the last 2 weeks. She could not identify any triggers for these spells, she can be up doing something or be at rest. She complains of mild headaches as well as nausea along with the spells. She has a history of migraine headaches which were severe when she was married, however the divorce she hasn't had headaches up until now. She has had no fever or chills, she denies any abdominal pain, she complains of mild left flank pain and is wondering if something is wrong with her kidneys. She has no lightheadedness or dizziness. She denies any chest pain or shortness of breath. She denies any dysuria. She denies any blood in her stool or in her urine.  ED Course: In the emergency room, vital signs are stable, blood work is essentially unremarkable. Chest x-ray is normal as is CT scan of the head. Neurology was consulted by EDP, and TRH was asked for admission.    Review of Systems: As per HPI otherwise 10 point review of systems negative.   No past medical history on file.  No past surgical history on file.   has no tobacco, alcohol, and drug history on file.  Allergies  Allergen Reactions  . Darvon [Propoxyphene] Rash and Other (See Comments)    Swelling, angioedema, shortness of breath  .  Hydrocodone Rash and Other (See Comments)    Swelling, angioedema, shortness of breath  . Morphine And Related Rash and Other (See Comments)    Swelling, angioedema, shortness of breath  . Oxycodone Rash and Other (See Comments)    Swelling, angioedema, shortness of breath  . Penicillins Itching and Rash    Family history Father - Heart attack - in his 6150s Brother - CHF - in his 7040s High sugar in family Mother - kidney failure - earlier this year  Prior to Admission medications   Medication Sig Start Date End Date Taking? Authorizing Provider  Cyanocobalamin (VITAMIN B-12 SL) Place 2 tablets under the tongue once.   Yes Historical Provider, MD  montelukast (SINGULAIR) 10 MG tablet Take 10 mg by mouth at bedtime. 10/05/16  Yes Historical Provider, MD  PROAIR HFA 108 (90 Base) MCG/ACT inhaler Inhale 2 puffs into the lungs daily as needed for shortness of breath. 07/25/16  Yes Historical Provider, MD  propranolol (INDERAL) 40 MG tablet Take 40 mg by mouth 2 (two) times daily. 10/11/16  Yes Historical Provider, MD    Physical Exam: Vitals:   10/15/16 1518  Weight: 69.5 kg (153 lb 3.5 oz)  Height: 5\' 7"  (1.702 m)      Constitutional: NAD, calm, comfortable Vitals:   10/15/16 1518  Weight: 69.5 kg (153 lb 3.5 oz)  Height: 5\' 7"  (1.702 m)   Eyes: PERRL, lids and conjunctivae normal ENMT: Mucous membranes are moist. Posterior pharynx clear of any exudate  or lesions.Normal dentition.  Neck: normal, supple, no masses, no thyromegaly Respiratory: clear to auscultation bilaterally, no wheezing, no crackles. Normal respiratory effort. No accessory muscle use.  Cardiovascular: Regular rate and rhythm, no murmurs / rubs / gallops. No extremity edema. 2+ pedal pulses.  Abdomen: no tenderness, no masses palpated. Bowel sounds positive.  Musculoskeletal: no clubbing / cyanosis. Normal muscle tone.  Skin: no rashes, lesions, ulcers. No induration Neurologic: CN 2-12 grossly intact. Strength  5/5 in all 4.  Psychiatric: Normal judgment and insight. Alert and oriented x 3. Normal mood.   Labs on Admission: I have personally reviewed following labs and imaging studies  CBC:  Recent Labs Lab 10/15/16 1502 10/15/16 1509  WBC  --  6.9  NEUTROABS  --  3.7  HGB 13.9 13.8  HCT 41.0 41.2  MCV  --  86.2  PLT  --  194   Basic Metabolic Panel:  Recent Labs Lab 10/15/16 1502 10/15/16 1509  NA 142 140  K 3.5 3.6  CL 104 106  CO2  --  25  GLUCOSE 87 88  BUN 11 10  CREATININE 0.70 0.78  CALCIUM  --  9.4   GFR: Estimated Creatinine Clearance (by C-G formula based on SCr of 0.78 mg/dL) Female: 16.175.4 mL/min Female: 95.2 mL/min Liver Function Tests:  Recent Labs Lab 10/15/16 1509  AST 45*  ALT 39  ALKPHOS 61  BILITOT 0.8  PROT 7.2  ALBUMIN 4.0   No results for input(s): LIPASE, AMYLASE in the last 168 hours. No results for input(s): AMMONIA in the last 168 hours. Coagulation Profile:  Recent Labs Lab 10/15/16 1509  INR 0.89   Cardiac Enzymes: No results for input(s): CKTOTAL, CKMB, CKMBINDEX, TROPONINI in the last 168 hours. BNP (last 3 results) No results for input(s): PROBNP in the last 8760 hours. HbA1C: No results for input(s): HGBA1C in the last 72 hours. CBG: No results for input(s): GLUCAP in the last 168 hours. Lipid Profile: No results for input(s): CHOL, HDL, LDLCALC, TRIG, CHOLHDL, LDLDIRECT in the last 72 hours. Thyroid Function Tests: No results for input(s): TSH, T4TOTAL, FREET4, T3FREE, THYROIDAB in the last 72 hours. Anemia Panel: No results for input(s): VITAMINB12, FOLATE, FERRITIN, TIBC, IRON, RETICCTPCT in the last 72 hours. Urine analysis:    Component Value Date/Time   COLORURINE STRAW (A) 10/15/2016 1510   APPEARANCEUR CLEAR 10/15/2016 1510   LABSPEC 1.003 (L) 10/15/2016 1510   PHURINE 7.0 10/15/2016 1510   GLUCOSEU NEGATIVE 10/15/2016 1510   HGBUR NEGATIVE 10/15/2016 1510   BILIRUBINUR NEGATIVE 10/15/2016 1510   KETONESUR  NEGATIVE 10/15/2016 1510   PROTEINUR NEGATIVE 10/15/2016 1510   NITRITE NEGATIVE 10/15/2016 1510   LEUKOCYTESUR NEGATIVE 10/15/2016 1510   Sepsis Labs: @LABRCNTIP (procalcitonin:4,lacticidven:4) )No results found for this or any previous visit (from the past 240 hour(s)).   Radiological Exams on Admission: Dg Chest 2 View  Result Date: 10/15/2016 CLINICAL DATA:  Nausea and left arm pain and tingling. EXAM: CHEST  2 VIEW COMPARISON:  None. FINDINGS: The heart size and mediastinal contours are within normal limits. There is no focal infiltrate, pulmonary edema, or pleural effusion. This around lucent density in in the left apex probably due to overlying artifact. The visualized skeletal structures are unremarkable. IMPRESSION: No active cardiopulmonary disease. Electronically Signed   By: Sherian ReinWei-Chen  Lin M.D.   On: 10/15/2016 16:29   Ct Head Code Stroke W/o Cm  Result Date: 10/15/2016 CLINICAL DATA:  Code stroke. Left hand weakness and tingling. Last seen normal  2.5 hours ago. EXAM: CT HEAD WITHOUT CONTRAST TECHNIQUE: Contiguous axial images were obtained from the base of the skull through the vertex without intravenous contrast. COMPARISON:  None. FINDINGS: Brain: Normal appearance without evidence of atrophy, old or acute infarction, mass lesion, hemorrhage, hydrocephalus or extra-axial collection. Vascular: No calcified or hyperdense vessels. Skull: Normal Sinuses/Orbits: Clear/ normal Other: None ASPECTS (Alberta Stroke Program Early CT Score) - Ganglionic level infarction (caudate, lentiform nuclei, internal capsule, insula, M1-M3 cortex): 7 - Supraganglionic infarction (M4-M6 cortex): 3 Total score (0-10 with 10 being normal): 10 IMPRESSION: 1. Normal head CT 2. ASPECTS is 10 Page placed by telephone at the time of interpretation on 10/15/2016 at 3:07 pm to Dr. Otelia Limes . Electronically Signed   By: Paulina Fusi M.D.   On: 10/15/2016 15:09    EKG: Independently reviewed. Sinus rhythm    Assessment/Plan Active Problems:   TIA (transient ischemic attack)   Concern for CVA  - Admit patient on stroke pathway, placement aspirin, obtain MRI and MRA of the brain, MRA of the neck, 2-D echo, lipid panel as well as hemoglobin A1c  - Given "spells" and self-reported confusion / fogginess, we'll check an EEG, check ammonia level - Appreciate neurology consultation   History of asthma - Continue home inhaler  DVT prophylaxis: Lovenox   Code Status: Full code   Family Communication: Mother at bedside  Disposition Plan: Admit to telemetry  Consults called: Neurology   Admission status: Observation     Pamella Pert, MD [facility] Pager [phone]  If 7PM-7AM, please contact night-coverage [email or web address] Password [email or web address]  10/15/2016, 5:28 PM

## 2016-10-15 NOTE — Progress Notes (Addendum)
Subjective:  By signing my name below, I, Savannah Trevino, attest that this documentation has been prepared under the direction and in the presence of Savannah SorensonEva Shaw, MD. Electronically Signed: Stann Oresung-Kai Trevino, Scribe. 10/15/2016 , 2:12 PM .  Patient was seen in Room 6 .   Patient ID: Savannah Trevino, female    DOB: [birth year], 55 y.o.   MRN: [id number] Chief Complaint  Patient presents with  . Arm Pain    Left arm  . Dizziness    W/Nausea  . Numbness    Left hand   HPI Savannah Trevino is a 55 y.o. female who [facility] complaining of left arm pain in her deltoid region. Patient was brought back emergently and we were called to Trauma Rm 6. Patient states she's been having spells of feeling flushed, and "feeling like going to pass out" for the past 2 weeks. Her left hand has been becoming numb and "feeling tingly". Her spells would lasts for 2~3 minutes and would have some relief after sitting still. She hasn't been able to sleep, and every morning, her arm has throbbing pain with fingers still asleep. She has these episodes both out shopping at the mall and also at rest. She states she works all the time, and is generally active, but no formal exercise. She is right hand dominant.   She notes tingling and weakness through her left arm. She has weakness in her left hand, present in office. She mentions that her legs also feel like they're about to give way. She also complains of nausea, especially worse during these episodes. When she lays down, she complains it makes her want to vomit. She denies chest pain, chest pressure, shortness of breath, syncope, urinary or bowel symptoms. She has had panic attacks in the past, but notes it was a long time ago.   Her PCP is in [place]. She tried calling them regarding her arm, but was informed to follow up in February. She has been on propanolol 40mg  bid for a long time, and singulair. 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 She's had  history of vertigo for a few times.   Family history includes:  Father - Heart attack - in his 2850s Brother - CHF - in his 7340s High sugar in family Mother - kidney failure - earlier this year  She has some intermittent trouble thinking, but not sure when this started.  She was brought in by a nurse from her church. She informs us that the patient is normally more active and this is not her usual self.   No past medical history on file. Prior to Admission medications   Not on File   Allergies  Allergen Reactions  . Hydrocodone   . Morphine And Related     Review of Systems  Constitutional: Positive for fatigue.  Respiratory: Negative for cough, chest tightness, shortness of breath and wheezing.   Cardiovascular: Negative for chest pain.  Gastrointestinal: Positive for nausea. Negative for diarrhea and vomiting.  Neurological: Positive for dizziness, weakness, light-headedness and numbness. Negative for syncope.  Psychiatric/Behavioral: Positive for confusion.       Objective:   Physical Exam  Constitutional: She is oriented to person, place, and time. She appears well-developed and well-nourished. No distress.  HENT:  Head: Normocephalic and atraumatic.  Eyes: EOM are normal. Pupils are equal, round, and reactive to light.  Neck: Neck supple. Carotid bruit is not present.  Cardiovascular: Normal rate.   Pulmonary/Chest: Effort normal and  breath sounds normal. No respiratory distress. She has no decreased breath sounds.  Musculoskeletal: Normal range of motion.  Neurological: She is alert and oriented to person, place, and time. She displays a negative Romberg sign.  No pronator drift  Skin: Skin is warm and dry.  Psychiatric: She has a normal mood and affect. Her behavior is normal.  Nursing note and vitals reviewed.  BP 130/80   Pulse 93   Temp 97.6 F (36.4 C)   Resp 16   Ht 5\' 7"  (1.702 m)   Wt 154 lb 9.6 oz (70.1 kg)   SpO2 100%   BMI 24.21 kg/m   Negative  orthostatics  EKG: sinus rhythm      Assessment & Plan:   1. Left arm pain   2. Left hand weakness   3. Episodic lightheadedness   4. Transient alteration of awareness   5. Ataxia   6. Left carpal tunnel syndrome   7. Uncomplicated asthma, unspecified asthma severity, unspecified whether persistent   8. Essential tremor   9. Family history of coronary artery disease   10. Nausea without vomiting    I suspect the patient does have left carpal tunnel syndrome. However at this point in time her alertness and affect are certainly atypical. She has an altered mental status and is certainly not safe to drive. She has been brought in by a congregational nurse who is also a Charity fundraiserN at [employer]  Had patient to 4 baby aspirin and given one sublingual level Zofran. No symptom improvement. EMS was called for nonemergent transport to the ER for further evaluation. Patient certainly could have a very atypical unstable angina and needs cardiac rule out as well as a UDS, metabolic panel, and likely a head CT scan due to her altered mental status.  Orders Placed This Encounter  Procedures  . Orthostatic vital signs  . POCT glucose (manual entry)  . EKG 12-Lead    Meds ordered this encounter  Medications  . aspirin chewable tablet 324 mg  . ondansetron (ZOFRAN-ODT) disintegrating tablet 4 mg    I personally performed the services described in this documentation, which was scribed in my presence. The recorded information has been reviewed and considered, and addended by me as needed.   Savannah Trevino, M.D.  [facility]  [place] [address] [place], [place] [address] [phone] phone [phone] fax  10/15/16 2:45 PM

## 2016-10-16 ENCOUNTER — Encounter (HOSPITAL_COMMUNITY): Payer: Self-pay | Admitting: Radiology

## 2016-10-16 ENCOUNTER — Observation Stay (HOSPITAL_COMMUNITY): Payer: BLUE CROSS/BLUE SHIELD

## 2016-10-16 DIAGNOSIS — G43109 Migraine with aura, not intractable, without status migrainosus: Secondary | ICD-10-CM

## 2016-10-16 DIAGNOSIS — G459 Transient cerebral ischemic attack, unspecified: Secondary | ICD-10-CM | POA: Diagnosis not present

## 2016-10-16 LAB — LIPID PANEL
Cholesterol: 205 mg/dL — ABNORMAL HIGH (ref 0–200)
HDL: 64 mg/dL (ref >40)
LDL Cholesterol: 127 mg/dL — ABNORMAL HIGH (ref 0–99)
Total CHOL/HDL Ratio: 3.2 RATIO
Triglycerides: 69 mg/dL (ref <150)
VLDL: 14 mg/dL (ref 0–40)

## 2016-10-16 MED ORDER — ASPIRIN EC 81 MG PO TBEC: 81.0000 mg | DELAYED_RELEASE_TABLET | Freq: Every day | ORAL | Status: DC

## 2016-10-16 MED ORDER — ATORVASTATIN CALCIUM 40 MG PO TABS: 40.0000 mg | ORAL_TABLET | Freq: Every day | ORAL | Status: DC

## 2016-10-16 MED ORDER — ASPIRIN EC 81 MG PO TBEC: 81.0000 mg | DELAYED_RELEASE_TABLET | Freq: Every day | ORAL | 1 refills | Status: DC

## 2016-10-16 MED ORDER — GADOBENATE DIMEGLUMINE 529 MG/ML IV SOLN
15.0000 mL | Freq: Once | INTRAVENOUS | Status: AC | PRN
  Administered 2016-10-16: 15 mL via INTRAVENOUS

## 2016-10-16 MED ORDER — ATORVASTATIN CALCIUM 40 MG PO TABS: 40.0000 mg | ORAL_TABLET | Freq: Every day | ORAL | 1 refills | Status: DC

## 2016-10-16 MED ORDER — GI COCKTAIL ~~LOC~~
30.0000 mL | Freq: Three times a day (TID) | ORAL | Status: DC | PRN
  Administered 2016-10-16: 30 mL via ORAL
  Filled 2016-10-16 (×2): qty 30

## 2016-10-16 MED ORDER — PROPRANOLOL HCL 40 MG PO TABS
40.0000 mg | ORAL_TABLET | Freq: Two times a day (BID) | ORAL | Status: DC
  Administered 2016-10-16: 40 mg via ORAL
  Filled 2016-10-16: qty 4
  Filled 2016-10-16: qty 1

## 2016-10-16 NOTE — Evaluation (Signed)
Physical Therapy Evaluation Patient Details Name: Lowella GripCorleah Mabin MRN: [id number] DOB: Mar 14, 1961 Today's Date: 10/16/2016   History of Present Illness  Patient is a 55 y/o female with hx of migraines presents with left arm pain, left shoulder pain as well as left arm numbness. Brain MRI and Ct unremarkable.  Clinical Impression  Patient presents with LUE pain in deltoid region, weakness and numbness in ulnar nerve distribution as well as self reported left eye diplopia that comes and goes in no particular pattern. Tolerated gait training and stair training without issues or LOB. Also with stiff cervical spine musculature as well as decreased cervical AROM. Question exaggeration of symptoms? Not a great historian with regards to reporting details of symptoms. Recommend imaging of cervical spine. Recommend OPPT for postural retraining and to help relieve pain in LUE. Pt does not require skilled therapy services. Discharge from therapy.    Follow Up Recommendations Outpatient PT    Equipment Recommendations  None recommended by PT    Recommendations for Other Services OT consult     Precautions / Restrictions Precautions Precautions: None Restrictions Weight Bearing Restrictions: No      Mobility  Bed Mobility Overal bed mobility: Needs Assistance Bed Mobility: Supine to Sit     Supine to sit: Modified independent (Device/Increase time)        Transfers Overall transfer level: Modified independent               General transfer comment: Stood from EOB x1. No issues.  Ambulation/Gait Ambulation/Gait assistance: Modified independent (Device/Increase time) Ambulation Distance (Feet): 150 Feet Assistive device: None Gait Pattern/deviations: Step-through pattern;Decreased stride length   Gait velocity interpretation: Below normal speed for age/gender General Gait Details: Slow and tentative but no issues with balance noted.   Stairs Stairs: Yes Stairs assistance:  Modified independent (Device/Increase time) Stair Management: One rail Right Number of Stairs: 3 (+2 steps x2 bouts) General stair comments: No assist needed. Safe technique.  Wheelchair Mobility    Modified Rankin (Stroke Patients Only) Modified Rankin (Stroke Patients Only) Pre-Morbid Rankin Score: No symptoms Modified Rankin: No significant disability     Balance Overall balance assessment: No apparent balance deficits (not formally assessed)                                           Pertinent Vitals/Pain Pain Assessment: Faces Faces Pain Scale: Hurts little more Pain Location: LUE around deltoid region Pain Descriptors / Indicators: Sore Pain Intervention(s): Monitored during session;Repositioned    Home Living Family/patient expects to be discharged to:: Private residence Living Arrangements: Alone Available Help at Discharge: Family;Available PRN/intermittently Type of Home: House Home Access: Stairs to enter Entrance Stairs-Rails: Right Entrance Stairs-Number of Steps: 3-4 Home Layout: One level Home Equipment: None      Prior Function Level of Independence: Independent         Comments: 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     Hand Dominance   Dominant Hand: Right    Extremity/Trunk Assessment   Upper Extremity Assessment Upper Extremity Assessment: Defer to OT evaluation (Mild weakness LUE during MMT grossly ~4/5 throughout. Coordination WFL. Reports numbness in ulnar nerve distribution.)    Lower Extremity Assessment Lower Extremity Assessment: Overall WFL for tasks assessed    Cervical / Trunk Assessment Cervical / Trunk Assessment: Other exceptions Cervical / Trunk Exceptions: Cervical  AROM limited throughout- flexion/extension, side bending, and rotation.  Communication   Communication: No difficulties  Cognition Arousal/Alertness: Awake/alert Behavior During Therapy: WFL for  tasks assessed/performed Overall Cognitive Status: Within Functional Limits for tasks assessed                 General Comments: Pt difficult time when asked indepth questions related to symptoms. Reports left eye diplopia at times but not consistent- correlates when LUE problems occur.     General Comments General comments (skin integrity, edema, etc.): Family present during session - mother and brother    Exercises     Assessment/Plan    PT Assessment Patent does not need any further PT services  PT Problem List            PT Treatment Interventions      PT Goals (Current goals can be found in the Care Plan section)  Acute Rehab PT Goals Patient Stated Goal: to go home PT Goal Formulation: All assessment and education complete, DC therapy    Frequency     Barriers to discharge        Co-evaluation               End of Session Equipment Utilized During Treatment: Gait belt Activity Tolerance: Patient tolerated treatment well Patient left: in bed;with call bell/phone within reach;with nursing/sitter in room;with family/visitor present Nurse Communication: Mobility status         Time: [id number] PT Time Calculation (min) (ACUTE ONLY): 20 min   Charges:   PT Evaluation $PT Eval Low Complexity: 1 Procedure     PT G Codes:        Miro Balderson A Bree Heinzelman 10/16/2016, 10:14 AM Mylo RedShauna Rashawnda Gaba, PT, DPT [phone]

## 2016-10-16 NOTE — Progress Notes (Signed)
Pt eval addendum- added G-codes    10/16/16 1014  PT Recommendation  Follow Up Recommendations Outpatient PT  PT G-Codes **NOT FOR INPATIENT CLASS**  Functional Assessment Tool Used clinical judgment  Functional Limitation Mobility: Walking and moving around  Mobility: Walking and Moving Around Current Status ([id number](G8978) CI  Mobility: Walking and Moving Around Goal Status [phone](G8979) CI  Mobility: Walking and Moving Around Discharge Status [phone](G8980) CI    Mylo RedShauna Simrat Kendrick, PT, DPT [phone]

## 2016-10-16 NOTE — Progress Notes (Signed)
STROKE TEAM PROGRESS NOTE   HISTORY OF PRESENT ILLNESS (per record) Savannah Trevino is an 55 y.o. female who [facility] for evaluation of acutely altered mentation. [facility] was reviewed:  "Savannah Trevino a 55 y.o.femalewho [facility] complaining of left arm pain in her deltoid region. Patient was brought back emergently and we were called to Trauma Rm 6. Patient states she's been having spells of feeling flushed, and "feeling like going to pass out" for the past 2 weeks. Her left hand has been becoming numb and "feeling tingly". Her spells would lasts for 2~3 minutes and would have some relief after sitting still. She hasn't been able to sleep, and every morning, her arm has throbbing pain with fingers still asleep. She has these episodes both out shopping at the mall and also at rest. She states she works all the time, and is generally active, but no formal exercise. She is right hand dominant.   She notes tingling and weakness through her left arm. She has weakness in her left hand, present in office. She mentions that her legs also feel like they're about to give way. She also complains of nausea, especially worse during these episodes. When she lays down, she complains it makes her want to vomit. She denies chest pain, chest pressure, shortness of breath, syncope, urinary or bowel symptoms. She has had panic attacks in the past, but notes it was a long time ago.   [place]. She tried calling them regarding her arm, but was informed to follow up in February. She has been on propanolol 40mg  bid for a long time, and singulair. 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 She's had history of vertigo for a few times."   The above history essentially agrees with information related to me by the patient and her friend in the ED. Her friend, who is a Engineer, civil (consulting)nurse, states that the patient's speech output is not at her baseline, but  does not seem to be aphasic. During bedside evaluation, the patient's speech output is sparse, with little spontaneity, but without errors of syntax or grammar, with no paraphasias or word finding difficulty, normal repetition and intact comprehension for all commands and questions.   LSN: 12:30 PM tPA Given: No: Due to no clear localizing findings on examination. Speech alteration not consistent with a focal cortical lesion.   SUBJECTIVE (INTERVAL HISTORY) Her sister is at the bedside.  Overall she feels her condition is rapidly improving. Her only complaint on ROS was HA and "possible" double visio in the left eye which she described as a second image around the first image   OBJECTIVE Temp:  [97.5 F (36.4 C)-98.3 F (36.8 C)] 98.3 F (36.8 C) (12/31 0600) Pulse Rate:  [65-97] 92 (12/31 0600) Cardiac Rhythm: Normal sinus rhythm (12/31 0700) Resp:  [12-25] 16 (12/31 0600) BP: (103-130)/(43-80) 103/48 (12/31 0600) SpO2:  [97 %-100 %] 98 % (12/31 0600) Weight:  [69.5 kg (153 lb 3.5 oz)-70.1 kg (154 lb 9.6 oz)] 69.5 kg (153 lb 3.5 oz) (12/30 1518)  CBC:   Recent Labs Lab 10/15/16 1502 10/15/16 1509  WBC  --  6.9  NEUTROABS  --  3.7  HGB 13.9 13.8  HCT 41.0 41.2  MCV  --  86.2  PLT  --  194    Basic Metabolic Panel:   Recent Labs Lab 10/15/16 1502 10/15/16 1509  NA 142 140  K 3.5 3.6  CL 104 106  CO2  --  25  GLUCOSE 87 88  BUN 11 10  CREATININE 0.70 0.78  CALCIUM  --  9.4    Lipid Panel:     Component Value Date/Time   CHOL 205 (H) 10/16/2016 0434   TRIG 69 10/16/2016 0434   HDL 64 10/16/2016 0434   CHOLHDL 3.2 10/16/2016 0434   VLDL 14 10/16/2016 0434   LDLCALC 127 (H) 10/16/2016 0434   HgbA1c: No results found for: HGBA1C Urine Drug Screen:     Component Value Date/Time   LABOPIA NONE DETECTED 10/15/2016 1510   COCAINSCRNUR NONE DETECTED 10/15/2016 1510   LABBENZ NONE DETECTED 10/15/2016 1510   AMPHETMU NONE DETECTED 10/15/2016 1510   THCU NONE  DETECTED 10/15/2016 1510   LABBARB NONE DETECTED 10/15/2016 1510      IMAGING  Dg Chest 2 View 10/15/2016 No active cardiopulmonary disease.  [facility] 10/16/2016 MRI HEAD: Normal.  MRA HEAD: Normal.  MRA NECK:  Normal.   Ct Head Code Stroke W/o Cm 10/15/2016 1. Normal head CT  2. ASPECTS is 10    MR Cervical Spine - pending (ordered by primary team  PHYSICAL EXAM General - Well nourished, well developed, in NAD   Cardiovascular - Regular rate and rhythm Pulmonary: CTA Abdomen: NT, ND, normal bowel sounds Extremities: No C/C/E  Neurological Exam Mental Status: Normal Orientation:  Oriented to person, place and time Speech:  Fluent; no dysarthria Cranial Nerves:  PERRL; EOMI; visual fields full, face grossly symmetric, hearing grossly intact; shrug symmetric and tongue midline Motor Exam:  Tone:  Within normal limits; Strength: 5/5 throughout Sensory: Intact to light touch throughout Coordination:  Intact finger to nose Gait: Deferred   ASSESSMENT/PLAN Ms. Savannah Trevino is a 55 y.o. female with history of migraine headaches, asthma, and panic attacks presenting with left arm pain, altered mental status, presyncope, left upper extremity weakness, speech difficulties, and bilateral lower extremity weakness. She did not receive IV t-PA due to no clear localizing findings.  Possible TIA versus complicated migraine:  Non-dominant   Resultant  Left face, arm and hand numbness  MRI - normal  MRA Head and Neck - normal  MR Cervical Spine - pending per primary team  Carotid Doppler - MRA neck  2D Echo - would order if patient has future event, but this event was not likely vascular in origin  LDL - 127  HgbA1c pending  VTE prophylaxis - Lovenox Diet Heart Room service appropriate? Yes; Fluid consistency: Thin  No antithrombotic prior to admission, now on aspirin 81 mg daily take with food to avoid stomach upset.  Patient  counseled to be compliant with her antithrombotic medications  Ongoing aggressive stroke risk factor management  Therapy recommendations: Outpatient physical therapy recommended. No follow-up occupational therapy.  Disposition:  Pending  Hypertension  Blood pressure tends to run mildly low  Permissive hypertension (OK if < 220/120) but gradually normalize in 5-7 days  Long-term BP goal normotensive  Hyperlipidemia  Home meds:  No lipid lowering medications prior to admission.  LDL 127, goal < 70  Add Lipitor 40 mg daily  Continue statin at discharge  Other Stroke Risk Factors  Migraines  Other Active Problems  Anxiety  Hospital day # 0  ATTENDING NOTE: Patient was seen and examined by me personally. Documentation reflects findings. The laboratory and radiographic studies reviewed by me. ROS completed by me personally and pertinent positives fully documented   Condition: stable  Assessment and plan completed by me personally and fully documented above. Plans/Recommendations include:     Patient underwent multiple examinations while in the hospital during which she exhibited non-physiologic symptoms.   This may have been complicated migraine.  Given the future risk for vascular event in this patient population, I agree that antiplatelet therapy and statin management are not unreasonable.  Apparently patient has dyspepsia with ASA and may not choose to take it.    Discussed case with primary team and advised that patient should see ophthalmologist for left eye changes.  This follow-up should occur as soon as possible  SIGNED BY: Dr. Sula Soda    To contact Stroke Continuity provider, please refer to [email or web address]. After hours, contact General Neurology

## 2016-10-16 NOTE — Evaluation (Signed)
Occupational Therapy Evaluation Patient Details Name: Savannah Trevino MRN: [id number] DOB: [birth year] Today's Date: 10/16/2016    History of Present Illness Patient is a 55 y/o female with hx of migraines presents with left arm pain, left shoulder pain as well as left arm numbness. Brain MRI and Ct unremarkable.   Clinical Impression   Pt reports she was independent with ADL PTA. Currently pt overall mod I for ADL and functional mobility. At this time, pt reports L UE pain/numbness has resolved; strength grossly 4+/5 and able to complete functional tasks without difficulty. Pt does reports L eye blurred vision but overall WFL with testing and during functional activities. Pt planning to d/c home alone with intermittent family supervision. No further acute OT needs identified; signing off at this time. Please re-consult if needs change. Thank you for this referral.    Follow Up Recommendations  No OT follow up    Equipment Recommendations  None recommended by OT    Recommendations for Other Services       Precautions / Restrictions Precautions Precautions: None Restrictions Weight Bearing Restrictions: No      Mobility Bed Mobility Overal bed mobility: Modified Independent Bed Mobility: Supine to Sit     Supine to sit: Modified independent (Device/Increase time)        Transfers Overall transfer level: Modified independent               General transfer comment: Stood from EOB x1. No issues.    Balance Overall balance assessment: No apparent balance deficits (not formally assessed)                                          ADL Overall ADL's : Modified independent                                       General ADL Comments: Pt able to complete ADL, functional mobility, and higher level balance activities without LOB or unsteadiness.      Vision Vision Assessment?: Yes Eye Alignment: Within Functional Limits Ocular  Range of Motion: Within Functional Limits Alignment/Gaze Preference: Within Defined Limits Tracking/Visual Pursuits: Able to track stimulus in all quads without difficulty Additional Comments: Pt reports blurred vision in L eye only. When using binocular vision; pt reports no deficits. Pt able to read and complete functional activities without difficulty.   Perception     Praxis      Pertinent Vitals/Pain Pain Assessment: No/denies pain Faces Pain Scale: Hurts little more Pain Location: LUE around deltoid region Pain Descriptors / Indicators: Sore Pain Intervention(s): Monitored during session;Repositioned     Hand Dominance Right   Extremity/Trunk Assessment Upper Extremity Assessment Upper Extremity Assessment: LUE deficits/detail LUE Deficits / Details: Grossly strength 4+/5. Pt does not report sensation changes. ROM WFL. Able to complete functional activities without assist or difficulty.   Lower Extremity Assessment Lower Extremity Assessment: Defer to PT evaluation   Cervical / Trunk Assessment Cervical / Trunk Assessment: Other exceptions Cervical / Trunk Exceptions: Cervical AROM limited throughout- flexion/extension, side bending, and rotation.   Communication Communication Communication: No difficulties   Cognition Arousal/Alertness: Awake/alert Behavior During Therapy: WFL for tasks assessed/performed Overall Cognitive Status: Within Functional Limits for tasks assessed  General Comments: Pt difficult time when asked indepth questions related to symptoms. Reports left eye diplopia at times but not consistent- correlates when LUE problems occur.    General Comments       Exercises       Shoulder Instructions      Home Living Family/patient expects to be discharged to:: Private residence Living Arrangements: Alone Available Help at Discharge: Family;Available PRN/intermittently Type of Home: House Home Access: Stairs to  enter Entergy CorporationEntrance Stairs-Number of Steps: 3-4 Entrance Stairs-Rails: Right Home Layout: One level     Bathroom Shower/Tub: Tub/shower unit Shower/tub characteristics: Engineer, building servicesCurtain Bathroom Toilet: Standard     Home Equipment: Grab bars - tub/shower          Prior Functioning/Environment Level of Independence: Independent        Comments: 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        OT Problem List:     OT Treatment/Interventions:      OT Goals(Current goals can be found in the care plan section) Acute Rehab OT Goals Patient Stated Goal: to go home OT Goal Formulation: All assessment and education complete, DC therapy  OT Frequency:     Barriers to D/C:            Co-evaluation              End of Session Nurse Communication: Mobility status  Activity Tolerance: Patient tolerated treatment well Patient left: in bed;with call bell/phone within reach;with family/visitor present   Time: 1610-96041059-1115 OT Time Calculation (min): 16 min Charges:  OT General Charges $OT Visit: 1 Procedure OT Evaluation $OT Eval Low Complexity: 1 Procedure G-Codes: OT G-codes **NOT FOR INPATIENT CLASS** Functional Assessment Tool Used: Clinical judgement Functional Limitation: Self care Self Care Current Status (V4098(G8987): At least 1 percent but less than 20 percent impaired, limited or restricted Self Care Goal Status (J1914(G8988): At least 1 percent but less than 20 percent impaired, limited or restricted Self Care Discharge Status [id number](G8989): At least 1 percent but less than 20 percent impaired, limited or restricted   Gaye AlkenBailey A Nevia Henkin M.S., OTR/L Pager: [phone]  10/16/2016, 1:09 PM

## 2016-10-16 NOTE — Progress Notes (Signed)
Patient ready for discharge to home; discharge instructions given and reviewed; Rx's given; patient discharged out via wheelchair.

## 2016-10-16 NOTE — Discharge Instructions (Signed)
Follow with PCP in 5-7 days  Please ask for C spine MRI  Please get a complete blood count and chemistry panel checked by your Primary MD at your next visit, and again as instructed by your Primary MD. Please get your medications reviewed and adjusted by your Primary MD.  Please request your Primary MD to go over all Hospital Tests and Procedure/Radiological results at the follow up, please get all Hospital records sent to your Prim MD by signing hospital release before you go home.  If you had Pneumonia of Lung problems at the Hospital: Please get a 2 view Chest X ray done in 6-8 weeks after hospital discharge or sooner if instructed by your Primary MD.  If you have Congestive Heart Failure: Please call your Cardiologist or Primary MD anytime you have any of the following symptoms:  1) 3 pound weight gain in 24 hours or 5 pounds in 1 week  2) shortness of breath, with or without a dry hacking cough  3) swelling in the hands, feet or stomach  4) if you have to sleep on extra pillows at night in order to breathe  Follow cardiac low salt diet and 1.5 lit/day fluid restriction.  If you have diabetes Accuchecks 4 times/day, Once in AM empty stomach and then before each meal. Log in all results and show them to your primary doctor at your next visit. If any glucose reading is under 80 or above 300 call your primary MD immediately.  If you have Seizure/Convulsions/Epilepsy: Please do not drive, operate heavy machinery, participate in activities at heights or participate in high speed sports until you have seen by Primary MD or a Neurologist and advised to do so again.  If you had Gastrointestinal Bleeding: Please ask your Primary MD to check a complete blood count within one week of discharge or at your next visit. Your endoscopic/colonoscopic biopsies that are pending at the time of discharge, will also need to followed by your Primary MD.  Get Medicines reviewed and adjusted. Please take  all your medications with you for your next visit with your Primary MD  Please request your Primary MD to go over all hospital tests and procedure/radiological results at the follow up, please ask your Primary MD to get all Hospital records sent to his/her office.  If you experience worsening of your admission symptoms, develop shortness of breath, life threatening emergency, suicidal or homicidal thoughts you must seek medical attention immediately by calling 911 or calling your MD immediately  if symptoms less severe.  You must read complete instructions/literature along with all the possible adverse reactions/side effects for all the Medicines you take and that have been prescribed to you. Take any new Medicines after you have completely understood and accpet all the possible adverse reactions/side effects.   Do not drive or operate heavy machinery when taking Pain medications.   Do not take more than prescribed Pain, Sleep and Anxiety Medications  Special Instructions: If you have smoked or chewed Tobacco  in the last 2 yrs please stop smoking, stop any regular Alcohol  and or any Recreational drug use.  Wear Seat belts while driving.  Please note You were cared for by a hospitalist during your hospital stay. If you have any questions about your discharge medications or the care you received while you were in the hospital after you are discharged, you can call the unit and asked to speak with the hospitalist on call if the hospitalist that took care of  you is not available. Once you are discharged, your primary care physician will handle any further medical issues. Please note that NO REFILLS for any discharge medications will be authorized once you are discharged, as it is imperative that you return to your primary care physician (or establish a relationship with a primary care physician if you do not have one) for your aftercare needs so that they can reassess your need for medications and  monitor your lab values.  You can reach the hospitalist office at phone [phone] or fax [phone]   If you do not have a primary care physician, you can call [phone] for a physician referral.  Activity: As tolerated with Full fall precautions use walker/cane & assistance as needed  Diet: regular  Disposition Home

## 2016-10-16 NOTE — Discharge Summary (Signed)
Physician Discharge Summary  Savannah Trevino ZOX:[id number] DOB: Nov 05, 1960 DOA: 10/15/2016  PCP: No PCP Per Patient  Admit date: 10/15/2016 Discharge date: 10/16/2016  Admitted From: home Disposition:  home  Recommendations for Outpatient Follow-up:  1. Follow up with PCP in 1-2 weeks 2. Follow up with ophthalmology in 1-2 weeks  Home Health: none Equipment/Devices: none  Discharge Condition: Stable CODE STATUS: Full code Diet recommendation: Regular  HPI: Savannah Trevino is a 55 y.o. female without much significant medical history, other than migraine headaches which have resolved since her divorce, presents to the emergency room with complaints of left arm pain, left shoulder pain as well as left arm numbness. She is also complaining of intermittent speech problems and she feels "confused" at times. This constellation of symptoms she labels as "spells" and she has noticed that they come and go and they have been doing so over the last 2 weeks. She could not identify any triggers for these spells, she can be up doing something or be at rest. She complains of mild headaches as well as nausea along with the spells. She has a history of migraine headaches which were severe when she was married, however the divorce she hasn't had headaches up until now. She has had no fever or chills, she denies any abdominal pain, she complains of mild left flank pain and is wondering if something is wrong with her kidneys. She has no lightheadedness or dizziness. She denies any chest pain or shortness of breath. She denies any dysuria. She denies any blood in her stool or in her urine. ED Course: In the emergency room, vital signs are stable, blood work is essentially unremarkable. Chest x-ray is normal as is CT scan of the head. Neurology was consulted by EDP, and [facility] was asked for admission.   Hospital Course: Discharge Diagnoses:  Active Problems:   TIA (transient ischemic attack)   Complicated  migraine  Patient was admitted to the hospital with intermittent symptoms and concern for CVA. Neurology was consulted and have followed patient while hospitalized. She underwent imaging with MRI and MRA of the brain which were essentially negative. I discussed with stroke attending M.D. on the day of discharge, her symptoms may be related to a complicated migraine. An MRI of the C-spine was ordered to evaluate given left shoulder and left upper extremity pain, however that could not be done on 12/31 given MRI with contrast the day prior and patient is asking to be discharged home. This can definitely be worked up further as an outpatient, she was advised to contact her PCP. Recommended patient follow-up with ophthalmology as well for her complaints of double vision in left eye. Neurology does recommend aspirin and statin given future risk for vascular events in patients with complicated migraines, and prescriptions were given to the patient prior to discharge.   Discharge Instructions   Allergies as of 10/16/2016      Reactions   Darvon [propoxyphene] Rash, Other (See Comments)   Swelling, angioedema, shortness of breath   Hydrocodone Rash, Other (See Comments)   Swelling, angioedema, shortness of breath   Morphine And Related Rash, Other (See Comments)   Swelling, angioedema, shortness of breath   Oxycodone Rash, Other (See Comments)   Swelling, angioedema, shortness of breath   Penicillins Itching, Rash      Medication List    TAKE these medications   aspirin EC 81 MG tablet Take 1 tablet (81 mg total) by mouth daily.   atorvastatin 40 MG  tablet Commonly known as:  LIPITOR Take 1 tablet (40 mg total) by mouth daily.   montelukast 10 MG tablet Commonly known as:  SINGULAIR Take 10 mg by mouth at bedtime.   PROAIR HFA 108 (90 Base) MCG/ACT inhaler Generic drug:  albuterol Inhale 2 puffs into the lungs daily as needed for shortness of breath.   propranolol 40 MG  tablet Commonly known as:  INDERAL Take 40 mg by mouth 2 (two) times daily.   VITAMIN B-12 SL Place 2 tablets under the tongue once.       Allergies  Allergen Reactions  . Darvon [Propoxyphene] Rash and Other (See Comments)    Swelling, angioedema, shortness of breath  . Hydrocodone Rash and Other (See Comments)    Swelling, angioedema, shortness of breath  . Morphine And Related Rash and Other (See Comments)    Swelling, angioedema, shortness of breath  . Oxycodone Rash and Other (See Comments)    Swelling, angioedema, shortness of breath  . Penicillins Itching and Rash    Consultations:  Neurology   Procedures/Studies:  Dg Chest 2 View  Result Date: 10/15/2016 CLINICAL DATA:  Nausea and left arm pain and tingling. EXAM: CHEST  2 VIEW COMPARISON:  None. FINDINGS: The heart size and mediastinal contours are within normal limits. There is no focal infiltrate, pulmonary edema, or pleural effusion. This around lucent density in in the left apex probably due to overlying artifact. The visualized skeletal structures are unremarkable. IMPRESSION: No active cardiopulmonary disease. Electronically Signed   By: Sherian Rein M.D.   On: 10/15/2016 16:29   Mr Maxine Glenn Neck W Wo Contrast  Result Date: 10/16/2016 CLINICAL DATA:  LEFT arm pain and numbness. Intermittent speech difficulties and confusion for 2 weeks. History of migraine headaches. EXAM: MRI HEAD WITHOUT CONTRAST MRA HEAD WITHOUT CONTRAST MRA NECK WITHOUT AND WITH CONTRAST TECHNIQUE: Multiplanar, multiecho pulse sequences of the brain and surrounding structures were obtained without intravenous contrast. Angiographic images of the Circle of Willis were obtained using MRA technique without intravenous contrast. Angiographic images of the neck were obtained using MRA technique without and with intravenous contrast. Carotid stenosis measurements (when applicable) are obtained utilizing NASCET criteria, using the distal internal carotid  diameter as the denominator. CONTRAST:  15mL MULTIHANCE GADOBENATE DIMEGLUMINE 529 MG/ML IV SOLN COMPARISON:  CT HEAD October 15, 2016 FINDINGS: MRI HEAD FINDINGS BRAIN: No reduced diffusion to suggest acute ischemia. No susceptibility artifact to suggest hemorrhage. The ventricles and sulci are normal for patient's age. No suspicious parenchymal signal, masses or mass effect. No abnormal extra-axial fluid collections. No extra-axial masses though, contrast enhanced sequences would be more sensitive. VASCULAR: Normal major intracranial vascular flow voids present at skull base. SKULL AND UPPER CERVICAL SPINE: No abnormal sellar expansion. No suspicious calvarial bone marrow signal. Craniocervical junction maintained. SINUSES/ORBITS: Small LEFT maxillary mucosal retention cyst without paranasal sinus air-fluid levels. Trace RIGHT mastoid effusion. The included ocular globes and orbital contents are non-suspicious. OTHER: None. MRA HEAD FINDINGS ANTERIOR CIRCULATION: Normal flow related enhancement of the included cervical, petrous, cavernous and supraclinoid internal carotid arteries. Patent anterior communicating artery. Normal flow related enhancement of the anterior and middle cerebral arteries, including distal segments. Supernumerary anterior cerebral artery. No large vessel occlusion, high-grade stenosis, abnormal luminal irregularity, aneurysm. POSTERIOR CIRCULATION: LEFT vertebral artery is dominant. Basilar artery is patent, with normal flow related enhancement of the main branch vessels. Normal flow related enhancement of the posterior cerebral arteries. Small bilateral posterior communicating arteries present. No large vessel occlusion,  high-grade stenosis, abnormal luminal irregularity, aneurysm. MRA NECK FINDINGS ANTERIOR CIRCULATION: The common carotid arteries are widely patent bilaterally. The carotid bifurcation is patent bilaterally and there is no hemodynamically significant carotid stenosis by  NASCET criteria. No evidence for atherosclerosis or flow limiting stenosis of the cervical internal carotid arteries. POSTERIOR CIRCULATION: Bilateral vertebral arteries are patent to the vertebrobasilar junction. No evidence for atherosclerosis or flow limiting stenosis. Source images and MIP image were reviewed. IMPRESSION: MRI HEAD: Normal. MRA HEAD: Normal. MRA NECK:  Normal. Electronically Signed   By: Awilda Metro M.D.   On: 10/16/2016 03:01   Mr Brain Wo Contrast  Result Date: 10/16/2016 CLINICAL DATA:  LEFT arm pain and numbness. Intermittent speech difficulties and confusion for 2 weeks. History of migraine headaches. EXAM: MRI HEAD WITHOUT CONTRAST MRA HEAD WITHOUT CONTRAST MRA NECK WITHOUT AND WITH CONTRAST TECHNIQUE: Multiplanar, multiecho pulse sequences of the brain and surrounding structures were obtained without intravenous contrast. Angiographic images of the Circle of Willis were obtained using MRA technique without intravenous contrast. Angiographic images of the neck were obtained using MRA technique without and with intravenous contrast. Carotid stenosis measurements (when applicable) are obtained utilizing NASCET criteria, using the distal internal carotid diameter as the denominator. CONTRAST:  15mL MULTIHANCE GADOBENATE DIMEGLUMINE 529 MG/ML IV SOLN COMPARISON:  CT HEAD October 15, 2016 FINDINGS: MRI HEAD FINDINGS BRAIN: No reduced diffusion to suggest acute ischemia. No susceptibility artifact to suggest hemorrhage. The ventricles and sulci are normal for patient's age. No suspicious parenchymal signal, masses or mass effect. No abnormal extra-axial fluid collections. No extra-axial masses though, contrast enhanced sequences would be more sensitive. VASCULAR: Normal major intracranial vascular flow voids present at skull base. SKULL AND UPPER CERVICAL SPINE: No abnormal sellar expansion. No suspicious calvarial bone marrow signal. Craniocervical junction maintained.  SINUSES/ORBITS: Small LEFT maxillary mucosal retention cyst without paranasal sinus air-fluid levels. Trace RIGHT mastoid effusion. The included ocular globes and orbital contents are non-suspicious. OTHER: None. MRA HEAD FINDINGS ANTERIOR CIRCULATION: Normal flow related enhancement of the included cervical, petrous, cavernous and supraclinoid internal carotid arteries. Patent anterior communicating artery. Normal flow related enhancement of the anterior and middle cerebral arteries, including distal segments. Supernumerary anterior cerebral artery. No large vessel occlusion, high-grade stenosis, abnormal luminal irregularity, aneurysm. POSTERIOR CIRCULATION: LEFT vertebral artery is dominant. Basilar artery is patent, with normal flow related enhancement of the main branch vessels. Normal flow related enhancement of the posterior cerebral arteries. Small bilateral posterior communicating arteries present. No large vessel occlusion, high-grade stenosis, abnormal luminal irregularity, aneurysm. MRA NECK FINDINGS ANTERIOR CIRCULATION: The common carotid arteries are widely patent bilaterally. The carotid bifurcation is patent bilaterally and there is no hemodynamically significant carotid stenosis by NASCET criteria. No evidence for atherosclerosis or flow limiting stenosis of the cervical internal carotid arteries. POSTERIOR CIRCULATION: Bilateral vertebral arteries are patent to the vertebrobasilar junction. No evidence for atherosclerosis or flow limiting stenosis. Source images and MIP image were reviewed. IMPRESSION: MRI HEAD: Normal. MRA HEAD: Normal. MRA NECK:  Normal. Electronically Signed   By: Awilda Metro M.D.   On: 10/16/2016 03:01   Mr Maxine Glenn Head/brain WU Cm  Result Date: 10/16/2016 CLINICAL DATA:  LEFT arm pain and numbness. Intermittent speech difficulties and confusion for 2 weeks. History of migraine headaches. EXAM: MRI HEAD WITHOUT CONTRAST MRA HEAD WITHOUT CONTRAST MRA NECK WITHOUT AND  WITH CONTRAST TECHNIQUE: Multiplanar, multiecho pulse sequences of the brain and surrounding structures were obtained without intravenous contrast. Angiographic images of the Circle of Willis  were obtained using MRA technique without intravenous contrast. Angiographic images of the neck were obtained using MRA technique without and with intravenous contrast. Carotid stenosis measurements (when applicable) are obtained utilizing NASCET criteria, using the distal internal carotid diameter as the denominator. CONTRAST:  15mL MULTIHANCE GADOBENATE DIMEGLUMINE 529 MG/ML IV SOLN COMPARISON:  CT HEAD October 15, 2016 FINDINGS: MRI HEAD FINDINGS BRAIN: No reduced diffusion to suggest acute ischemia. No susceptibility artifact to suggest hemorrhage. The ventricles and sulci are normal for patient's age. No suspicious parenchymal signal, masses or mass effect. No abnormal extra-axial fluid collections. No extra-axial masses though, contrast enhanced sequences would be more sensitive. VASCULAR: Normal major intracranial vascular flow voids present at skull base. SKULL AND UPPER CERVICAL SPINE: No abnormal sellar expansion. No suspicious calvarial bone marrow signal. Craniocervical junction maintained. SINUSES/ORBITS: Small LEFT maxillary mucosal retention cyst without paranasal sinus air-fluid levels. Trace RIGHT mastoid effusion. The included ocular globes and orbital contents are non-suspicious. OTHER: None. MRA HEAD FINDINGS ANTERIOR CIRCULATION: Normal flow related enhancement of the included cervical, petrous, cavernous and supraclinoid internal carotid arteries. Patent anterior communicating artery. Normal flow related enhancement of the anterior and middle cerebral arteries, including distal segments. Supernumerary anterior cerebral artery. No large vessel occlusion, high-grade stenosis, abnormal luminal irregularity, aneurysm. POSTERIOR CIRCULATION: LEFT vertebral artery is dominant. Basilar artery is patent, with  normal flow related enhancement of the main branch vessels. Normal flow related enhancement of the posterior cerebral arteries. Small bilateral posterior communicating arteries present. No large vessel occlusion, high-grade stenosis, abnormal luminal irregularity, aneurysm. MRA NECK FINDINGS ANTERIOR CIRCULATION: The common carotid arteries are widely patent bilaterally. The carotid bifurcation is patent bilaterally and there is no hemodynamically significant carotid stenosis by NASCET criteria. No evidence for atherosclerosis or flow limiting stenosis of the cervical internal carotid arteries. POSTERIOR CIRCULATION: Bilateral vertebral arteries are patent to the vertebrobasilar junction. No evidence for atherosclerosis or flow limiting stenosis. Source images and MIP image were reviewed. IMPRESSION: MRI HEAD: Normal. MRA HEAD: Normal. MRA NECK:  Normal. Electronically Signed   By: Awilda Metroourtnay  Bloomer M.D.   On: 10/16/2016 03:01   Ct Head Code Stroke W/o Cm  Result Date: 10/15/2016 CLINICAL DATA:  Code stroke. Left hand weakness and tingling. Last seen normal 2.5 hours ago. EXAM: CT HEAD WITHOUT CONTRAST TECHNIQUE: Contiguous axial images were obtained from the base of the skull through the vertex without intravenous contrast. COMPARISON:  None. FINDINGS: Brain: Normal appearance without evidence of atrophy, old or acute infarction, mass lesion, hemorrhage, hydrocephalus or extra-axial collection. Vascular: No calcified or hyperdense vessels. Skull: Normal Sinuses/Orbits: Clear/ normal Other: None ASPECTS (Alberta Stroke Program Early CT Score) - Ganglionic level infarction (caudate, lentiform nuclei, internal capsule, insula, M1-M3 cortex): 7 - Supraganglionic infarction (M4-M6 cortex): 3 Total score (0-10 with 10 being normal): 10 IMPRESSION: 1. Normal head CT 2. ASPECTS is 10 Page placed by telephone at the time of interpretation on 10/15/2016 at 3:07 pm to Dr. Otelia LimesLindzen . Electronically Signed   By: Paulina FusiMark   Shogry M.D.   On: 10/15/2016 15:09     Subjective: - no chest pain, shortness of breath, no abdominal pain, nausea or vomiting.   Discharge Exam: Vitals:   10/16/16 1128 10/16/16 1354  BP: (!) 108/52 (!) 94/48  Pulse: 78 66  Resp: 18 16  Temp: 98.1 F (36.7 C) 98.7 F (37.1 C)   Vitals:   10/16/16 0400 10/16/16 0600 10/16/16 1128 10/16/16 1354  BP: 113/71 (!) 103/48 (!) 108/52 (!) 94/48  Pulse: 68  92 78 66  Resp: 16 16 18 16   Temp: 97.7 F (36.5 C) 98.3 F (36.8 C) 98.1 F (36.7 C) 98.7 F (37.1 C)  TempSrc: Oral Oral Oral Oral  SpO2: 97% 98% 100% 100%  Weight:      Height:        General: Pt is alert, awake, not in acute distress Cardiovascular: RRR, S1/S2 +, no rubs, no gallops Respiratory: CTA bilaterally, no wheezing, no rhonchi Abdominal: Soft, NT, ND, bowel sounds + Extremities: no edema, no cyanosis    The results of significant diagnostics from this hospitalization (including imaging, microbiology, ancillary and laboratory) are listed below for reference.     Microbiology: No results found for this or any previous visit (from the past 240 hour(s)).   Labs: BNP (last 3 results) No results for input(s): BNP in the last 8760 hours. Basic Metabolic Panel:  Recent Labs Lab 10/15/16 1502 10/15/16 1509  NA 142 140  K 3.5 3.6  CL 104 106  CO2  --  25  GLUCOSE 87 88  BUN 11 10  CREATININE 0.70 0.78  CALCIUM  --  9.4   Liver Function Tests:  Recent Labs Lab 10/15/16 1509  AST 45*  ALT 39  ALKPHOS 61  BILITOT 0.8  PROT 7.2  ALBUMIN 4.0   No results for input(s): LIPASE, AMYLASE in the last 168 hours.  Recent Labs Lab 10/15/16 1813  AMMONIA 26   CBC:  Recent Labs Lab 10/15/16 1502 10/15/16 1509  WBC  --  6.9  NEUTROABS  --  3.7  HGB 13.9 13.8  HCT 41.0 41.2  MCV  --  86.2  PLT  --  194   Cardiac Enzymes: No results for input(s): CKTOTAL, CKMB, CKMBINDEX, TROPONINI in the last 168 hours. BNP: Invalid input(s):  POCBNP CBG: No results for input(s): GLUCAP in the last 168 hours. D-Dimer No results for input(s): DDIMER in the last 72 hours. Hgb A1c No results for input(s): HGBA1C in the last 72 hours. Lipid Profile  Recent Labs  10/16/16 0434  CHOL 205*  HDL 64  LDLCALC 127*  TRIG 69  CHOLHDL 3.2   Thyroid function studies No results for input(s): TSH, T4TOTAL, T3FREE, THYROIDAB in the last 72 hours.  Invalid input(s): FREET3 Anemia work up No results for input(s): VITAMINB12, FOLATE, FERRITIN, TIBC, IRON, RETICCTPCT in the last 72 hours. Urinalysis    Component Value Date/Time   COLORURINE STRAW (A) 10/15/2016 1510   APPEARANCEUR CLEAR 10/15/2016 1510   LABSPEC 1.003 (L) 10/15/2016 1510   PHURINE 7.0 10/15/2016 1510   GLUCOSEU NEGATIVE 10/15/2016 1510   HGBUR NEGATIVE 10/15/2016 1510   BILIRUBINUR NEGATIVE 10/15/2016 1510   KETONESUR NEGATIVE 10/15/2016 1510   PROTEINUR NEGATIVE 10/15/2016 1510   NITRITE NEGATIVE 10/15/2016 1510   LEUKOCYTESUR NEGATIVE 10/15/2016 1510   Sepsis Labs Invalid input(s): PROCALCITONIN,  WBC,  LACTICIDVEN Microbiology No results found for this or any previous visit (from the past 240 hour(s)).   Time coordinating discharge: Over 30 minutes  SIGNED:  Pamella PertGHERGHE, COSTIN, MD  [facility] 10/16/2016, 3:29 PM Pager [phone]  If 7PM-7AM, please contact night-coverage [email or web address] Password [email or web address]

## 2016-10-17 LAB — GLUCOSE, CAPILLARY: Glucose-Capillary: 81 mg/dL (ref 65–99)

## 2016-10-18 LAB — HEMOGLOBIN A1C
Hgb A1c MFr Bld: 5 % (ref 4.8–5.6)
Mean Plasma Glucose: 97 mg/dL

## 2020-01-28 ENCOUNTER — Other Ambulatory Visit (HOSPITAL_COMMUNITY)
Admission: RE | Admit: 2020-01-28 | Discharge: 2020-01-28 | Disposition: A | Discharge status: Home / Self Care | Payer: 59 | Source: Ambulatory Visit | Attending: Family Medicine | Admitting: Family Medicine

## 2020-01-28 ENCOUNTER — Other Ambulatory Visit: Payer: Self-pay | Admitting: Family Medicine

## 2020-01-28 ENCOUNTER — Inpatient Hospital Stay (HOSPITAL_COMMUNITY): Admission: RE | Admit: 2020-01-28 | Payer: BLUE CROSS/BLUE SHIELD | Source: Ambulatory Visit

## 2020-01-28 DIAGNOSIS — Z124 Encounter for screening for malignant neoplasm of cervix: Secondary | ICD-10-CM | POA: Insufficient documentation

## 2020-01-31 ENCOUNTER — Other Ambulatory Visit (HOSPITAL_COMMUNITY): Payer: Self-pay | Admitting: Family Medicine

## 2020-01-31 DIAGNOSIS — Z1231 Encounter for screening mammogram for malignant neoplasm of breast: Secondary | ICD-10-CM

## 2020-01-31 LAB — CYTOLOGY - PAP
Comment: NEGATIVE
Diagnosis: NEGATIVE
High risk HPV: NEGATIVE

## 2020-03-19 ENCOUNTER — Encounter (HOSPITAL_COMMUNITY): Payer: Self-pay

## 2020-03-19 ENCOUNTER — Emergency Department (HOSPITAL_COMMUNITY)
Admission: EM | Admit: 2020-03-19 | Discharge: 2020-03-19 | Disposition: A | Discharge status: Home / Self Care | Payer: 59 | Attending: Emergency Medicine | Admitting: Emergency Medicine

## 2020-03-19 ENCOUNTER — Emergency Department (HOSPITAL_COMMUNITY): Payer: 59

## 2020-03-19 DIAGNOSIS — R55 Syncope and collapse: Secondary | ICD-10-CM | POA: Insufficient documentation

## 2020-03-19 DIAGNOSIS — Z7982 Long term (current) use of aspirin: Secondary | ICD-10-CM | POA: Diagnosis not present

## 2020-03-19 DIAGNOSIS — R110 Nausea: Secondary | ICD-10-CM | POA: Insufficient documentation

## 2020-03-19 DIAGNOSIS — J45909 Unspecified asthma, uncomplicated: Secondary | ICD-10-CM | POA: Insufficient documentation

## 2020-03-19 DIAGNOSIS — F419 Anxiety disorder, unspecified: Secondary | ICD-10-CM | POA: Insufficient documentation

## 2020-03-19 DIAGNOSIS — R0602 Shortness of breath: Secondary | ICD-10-CM | POA: Insufficient documentation

## 2020-03-19 DIAGNOSIS — Z79899 Other long term (current) drug therapy: Secondary | ICD-10-CM | POA: Insufficient documentation

## 2020-03-19 DIAGNOSIS — R079 Chest pain, unspecified: Secondary | ICD-10-CM

## 2020-03-19 DIAGNOSIS — Z634 Disappearance and death of family member: Secondary | ICD-10-CM | POA: Insufficient documentation

## 2020-03-19 DIAGNOSIS — R0789 Other chest pain: Secondary | ICD-10-CM | POA: Diagnosis present

## 2020-03-19 LAB — CBC
HCT: 43.7 % (ref 36.0–46.0)
Hemoglobin: 14.2 g/dL (ref 12.0–15.0)
MCH: 28.4 pg (ref 26.0–34.0)
MCHC: 32.5 g/dL (ref 30.0–36.0)
MCV: 87.4 fL (ref 80.0–100.0)
Platelets: 257 10*3/uL (ref 150–400)
RBC: 5 MIL/uL (ref 3.87–5.11)
RDW: 12.6 % (ref 11.5–15.5)
WBC: 8.5 10*3/uL (ref 4.0–10.5)
nRBC: 0 % (ref 0.0–0.2)

## 2020-03-19 LAB — BASIC METABOLIC PANEL
Anion gap: 10 (ref 5–15)
BUN: 14 mg/dL (ref 8–23)
CO2: 24 mmol/L (ref 22–32)
Calcium: 9.4 mg/dL (ref 8.9–10.3)
Chloride: 106 mmol/L (ref 98–111)
Creatinine, Ser: 0.89 mg/dL (ref 0.44–1.00)
GFR calc Af Amer: >60 mL/min (ref >60)
GFR calc non Af Amer: >60 mL/min (ref >60)
Glucose, Bld: 94 mg/dL (ref 70–99)
Potassium: 4.1 mmol/L (ref 3.5–5.1)
Sodium: 140 mmol/L (ref 135–145)

## 2020-03-19 LAB — TROPONIN I (HIGH SENSITIVITY)
Troponin I (High Sensitivity): <2 ng/L (ref <18)
Troponin I (High Sensitivity): <2 ng/L (ref <18)

## 2020-03-19 MED ORDER — SODIUM CHLORIDE 0.9% FLUSH: 3.0000 mL | Freq: Once | INTRAVENOUS | Status: DC

## 2020-03-19 NOTE — Discharge Instructions (Addendum)
Please keep your appointment with cardiology.  Please follow-up with your primary care doctor for recommendations on treatment for anxiety.  I recommend an SSRI be initiated at the discretion of your family doctor.  As discussed please return to the ED immediately for any new or concerning symptoms.  Please drink plenty of water, rest and monitor your symptoms closely.  Your chest x-ray, EKG, blood work was reassuring.  Provided you with another cardiologist group to reach out to you if you are unable to see the one recommended by your primary care doctor

## 2020-03-19 NOTE — ED Provider Notes (Signed)
[facility] EMERGENCY DEPARTMENT Provider Note   CSN: [id number] Arrival date & time: 03/19/20  1413     History Chief Complaint  Patient presents with  . Chest Pain    Savannah Trevino is a 59 y.o. female.  HPI Patient is a 59 year old female with a past medical history significant for complex migraines, anxiety, depression, HLD, HTN, episodic chest pain  Patient states that she is presented today for left-sided sharp stabbing intermittent chest pain that has been going on for 4 weeks.  She states that she discussed her symptoms with her primary care doctor who recommended following up with a cardiologist they gave her emergency return precautions.  Patient states that she has no cardiac history is never had an MI, NSTEMI or other heart issue.  She denies any history of CHF.  She states that the pain is 7/10 and sharp in nature sporadic with no aggravating or mitigating factors.  She states that it can occur at any time whether at rest or while walking.  She denies any vomiting but states she did feel nauseous some 3-4 times over the past month.  She states that this does not occur with the chest pain.  She also states that when the pain has started is so painful that it makes her feel short of breath however she denies any difficulty breathing.  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  She states that she has felt extreme grief the last few weeks.  She denies any cough, hemoptysis, lightheadedness, dizziness.  She states that she did have one episode of syncope where she was in her bedroom and passed out onto her bed for unknown amount of time.  She woke up and felt fine.  She states she had no chest pain or shortness of breath during or before or after the episode.  This was several weeks ago.  Patient denies any lower leg swelling, history of VTE/PE/DVT, denies any  hemoptysis, heart palpitations long travel, surgery or immobilization.  She states she is not a smoker.  Denies any first-degree relative with early heart attack.  No diabetes. HPI: A 59 year old patient with a history of hypertension and hypercholesterolemia presents for evaluation of chest pain. Initial onset of pain was approximately 3-6 hours ago. The patient's chest pain is sharp and is not worse with exertion. The patient's chest pain is middle- or left-sided, is not well-localized, is not described as heaviness/pressure/tightness and does not radiate to the arms/jaw/neck. The patient does not complain of nausea and denies diaphoresis. The patient has no history of stroke, has no history of peripheral artery disease, has not smoked in the past 90 days, denies any history of treated diabetes, has no relevant family history of coronary artery disease (first degree relative at less than age 75) and does not have an elevated BMI (>=30).   History reviewed. No pertinent past medical history.  Patient Active Problem List   Diagnosis Date Noted  . Complicated migraine   . TIA (transient ischemic attack) 10/15/2016  . RECTAL POLYPS 02/11/2009  . HEADACHE 01/28/2009  . CHEST PAIN, RIGHT 12/17/2008  . BENIGN POSITIONAL VERTIGO 09/17/2008  . HYPERLIPIDEMIA 11/13/2007  . NIPPLE DISCHARGE 11/13/2007  . NECK PAIN 11/12/2007  . INSOMNIA 01/16/2007  . ANXIETY 12/05/2006  . TOBACCO USER 12/05/2006  . DEPRESSION 12/05/2006  . COMMON MIGRAINE 12/05/2006  . ALLERGIC RHINITIS 12/05/2006  .  ASTHMA 12/05/2006  . GERD 12/05/2006  . OVERACTIVE BLADDER 12/05/2006  . ARTHRITIS 12/05/2006    Past Surgical History:  Procedure Laterality Date  . CHOLECYSTECTOMY    . DILATION AND CURETTAGE OF UTERUS    . HAND SURGERY Right   . KNEE ARTHROSCOPY Left   . TUBAL LIGATION       OB History   No obstetric history on file.     No family history on file.  Social History   Tobacco Use  . Smoking  status: Never Smoker  . Smokeless tobacco: Never Used  Substance Use Topics  . Alcohol use: No  . Drug use: No    Home Medications Prior to Admission medications   Medication Sig Start Date End Date Taking? Authorizing Provider  aspirin EC 81 MG tablet Take 1 tablet (81 mg total) by mouth daily. 10/16/16   Caren Griffins, MD  atorvastatin (LIPITOR) 40 MG tablet Take 1 tablet (40 mg total) by mouth daily. 10/16/16   Caren Griffins, MD  Cyanocobalamin (VITAMIN B-12 SL) Place 2 tablets under the tongue once.    [provider]  montelukast (SINGULAIR) 10 MG tablet Take 10 mg by mouth at bedtime. 10/05/16   [provider]  PROAIR HFA 108 (90 Base) MCG/ACT inhaler Inhale 2 puffs into the lungs daily as needed for shortness of breath. 07/25/16   [provider]  propranolol (INDERAL) 40 MG tablet Take 40 mg by mouth 2 (two) times daily. 10/11/16   [provider]    Allergies    Darvon [propoxyphene], Hydrocodone, Morphine and related, Oxycodone, Cephalosporins, Codeine, Morphine, Nsaids, Penicillamine, Penicillins, Salicylates, and Penicillins  Review of Systems   Review of Systems  Constitutional: Negative for chills and fever.  HENT: Negative for congestion.   Eyes: Negative for pain.  Respiratory: Positive for shortness of breath. Negative for cough.   Cardiovascular: Positive for chest pain. Negative for leg swelling.  Gastrointestinal: Positive for nausea. Negative for abdominal pain and vomiting.  Genitourinary: Negative for dysuria.  Musculoskeletal: Negative for myalgias.  Skin: Negative for rash.  Neurological: Negative for dizziness and headaches.  Currently with no symptoms  Physical Exam Updated Vital Signs BP 126/69   Pulse 81   Temp 97.9 F (36.6 C) (Oral)   Resp 16   Ht 5\' 7"  (1.702 m)   Wt 70.8 kg   SpO2 100%   BMI 24.43 kg/m   CONSTITUTIONAL:  well-appearing, NAD NEURO:  Alert and oriented x 3, no focal  deficits EYES:  pupils equal and reactive ENT/NECK:  trachea midline, no JVD CARDIO:  reg rate, reg rhythm, well-perfused PULM:  None labored breathing, lungs are clear to auscultation in all fields.  There is some tenderness on palpation of chest wall GI/GU:  Abdomen non-distended MSK/SPINE:  No gross deformities, no edema SKIN:  no rash obvious, atraumatic, no ecchymosis  PSYCH:  Appropriate speech and behavior Peripheral vascular: No unilateral leg swelling.  Lower extremities without any edema  ED Results / Procedures / Treatments   Labs (all labs ordered are listed, but only abnormal results are displayed) Labs Reviewed  BASIC METABOLIC PANEL  CBC  TROPONIN I (HIGH SENSITIVITY)  TROPONIN I (HIGH SENSITIVITY)    EKG None  Radiology DG Chest 2 View  Result Date: 03/19/2020 CLINICAL DATA:  59 year old female with chest pain. EXAM: CHEST - 2 VIEW COMPARISON:  Chest radiograph dated 10/15/2016. FINDINGS: The heart size and mediastinal contours are within normal limits. Both lungs are  clear. The visualized skeletal structures are unremarkable. IMPRESSION: No active cardiopulmonary disease. Electronically Signed   By: Elgie Collard M.D.   On: 03/19/2020 15:10    Procedures Procedures (including critical care time)  Medications Ordered in ED Medications  sodium chloride flush (NS) 0.9 % injection 3 mL (0 mLs Intravenous Hold 03/19/20 2139)    ED Course  I have reviewed the triage vital signs and the nursing notes.  Pertinent labs & imaging results that were available during my care of the patient were reviewed by me and considered in my medical decision making (see chart for details).  Patient is 59 year old female with past medical history presented above.  She is presented today with 4 weeks of episodic sharp chest pain.  Patient states that she thinks that this may be due to anxiety.  Physical exam is unremarkable though she does have a tender left pectoral muscle.  It  does not reproduce the exact pain she is experiencing however.  Troponin next to within normal limits.  CBC and BMP unremarkable.  Chest x-ray without acute abnormality and EKG is normal sinus rhythm with no evidence of ischemia no axis deviation or acute abnormalities.  The emergent causes of chest pain include: Acute coronary syndrome, tamponade, pericarditis/myocarditis, aortic dissection, pulmonary embolism, tension pneumothorax, pneumonia, and esophageal rupture.  I do not believe the patient has an emergent cause of chest pain, other urgent/non-acute considerations include, but are not limited to: chronic angina, aortic stenosis, cardiomyopathy, mitral valve prolapse, pulmonary hypertension, aortic insufficiency, right ventricular hypertrophy, pleuritis, bronchitis, pneumothorax, tumor, gastroesophageal reflux disease (GERD), esophageal spasm, Mallory-Weiss syndrome, peptic ulcer disease, pancreatitis, functional gastrointestinal pain, cervical or thoracic disk disease or arthritis, shoulder arthritis, costochondritis, subacromial bursitis, anxiety or panic attack, herpes zoster, breast disorders, chest wall tumors, thoracic outlet syndrome, mediastinitis.  Low heart score.  She is PERC negative.  Clinical Course as of Mar 19 2248  Thu Mar 19, 2020  2220 Troponin next to undetectable, doubt ACS, doubt unstable angina  BMP without electrolyte abnormality or kidney function abnormality.  CBC without leukocytosis or anemia.  Chest x-ray is clear and I agree of radiology read that there is no evidence of infiltrate or abnormality.  EKG is normal sinus rhythm with no evidence of ischemia.   [WF]    Clinical Course User Index [WF] Gailen Shelter, [place]     MDM Rules/Calculators/A&P HEAR Score: 2                    Patient discharged at this time with close follow-up with her cardiologist who she has already been referred to.  She has not seen this cardiologist yet but was referred by her  primary care doctor who saw her earlier today.  Patient is ambulatory, symptom-free and agreeable to discharge at this time.  She is instructed on precautions.   The medical records were personally reviewed by myself. I personally reviewed all lab results and interpreted all imaging studies and either concurred with their official read or contacted radiology for clarification. Additional history obtained from old records/EMS/family members.  This patient appears reasonably screened and I doubt any other medical condition requiring further workup, evaluation, or treatment in the ED at this time prior to discharge.   Patient's vitals are WNL apart from vital sign abnormalities discussed above, patient is in NAD, and able to ambulate in the ED at their baseline and able to tolerate PO.  Pain has been managed or a plan has been  made for home management and has no complaints prior to discharge. Patient is comfortable with above plan and for discharge at this time. All questions were answered prior to disposition. Results from the ER workup discussed with the patient face to face and all questions answered to the best of my ability. The patient is safe for discharge with strict return precautions. Patient appears safe for discharge with appropriate follow-up. Conveyed my impression with the patient and they voiced understanding and are agreeable to plan.   An After Visit Summary was printed and given to the patient.  Portions of this note were generated with Scientist, clinical (histocompatibility and immunogenetics). Dictation errors may occur despite best attempts at proofreading.    Final Clinical Impression(s) / ED Diagnoses Final diagnoses:  Chest pain, unspecified type    Rx / DC Orders ED Discharge Orders    None       Gailen Shelter, [place] 03/19/20 2250    Alvira Monday, MD 03/20/20 1328

## 2020-03-19 NOTE — ED Notes (Signed)
Patient verbalizes understanding of discharge instructions. Opportunity for questioning and answers were provided. Armband removed by staff, pt discharged from ED ambulatory to home.  

## 2020-03-19 NOTE — ED Triage Notes (Signed)
Pt arrives to ED w/ L sided, non-radiating 7/10 pain, chest pain. Pt endorses sob, nausea, denies vomiting. Pt denies cardiac hx.

## 2020-04-30 ENCOUNTER — Ambulatory Visit: Payer: 59 | Admitting: Cardiovascular Disease

## 2020-05-25 ENCOUNTER — Encounter: Payer: Self-pay | Admitting: *Deleted

## 2020-05-25 ENCOUNTER — Ambulatory Visit (INDEPENDENT_AMBULATORY_CARE_PROVIDER_SITE_OTHER): Payer: 59 | Admitting: Cardiology

## 2020-05-25 ENCOUNTER — Telehealth: Payer: Self-pay | Admitting: Cardiology

## 2020-05-25 ENCOUNTER — Encounter: Payer: Self-pay | Admitting: Cardiology

## 2020-05-25 VITALS — BP 122/68 | HR 88 | Ht 67.0 in | Wt 158.0 lb

## 2020-05-25 DIAGNOSIS — R079 Chest pain, unspecified: Secondary | ICD-10-CM

## 2020-05-25 NOTE — Patient Instructions (Signed)
Your physician recommends that you schedule a follow-up appointment in: PENDING WITH DR BRANCH  Your physician recommends that you continue on your current medications as directed. Please refer to the Current Medication list given to you today.  Your physician has requested that you have an echocardiogram. Echocardiography is a painless test that uses sound waves to create images of your heart. It provides your doctor with information about the size and shape of your heart and how well your heart's chambers and valves are working. This procedure takes approximately one hour. There are no restrictions for this procedure.  Thank you for choosing [facility]!!    

## 2020-05-25 NOTE — Telephone Encounter (Signed)
Pre-cert Verification for the following procedure    ECHO  DATE:  05/27/2020   LOCATION: [facility]

## 2020-05-25 NOTE — Progress Notes (Signed)
Clinical Summary Ms. Savannah Trevino is a 59 y.o.female seen as new consult, referred by Dr Tracie Harrier for chest pain  1. Chest pain - ER Visit 03/2020 with chest pain - trop undetectable. EKG without ischemic changes. CXR no acute process  - sharp pain, left sided. 8/10 in severity. Rest or with exertion. Not positional. Would last a few secods to a minutes. +SOB. Squeezing in chest. Would get sweaty. +nausea - ongoing since ER visit, some increase in frequency - no relation to food   - CAD risk factors: brother CHF, father heart problems.  - brother passed recently she had been taking care of him   2. Syncope - isolated episode in early June - occurred at home - she was walking from kitchen to the bathroom. No prodrome, down to floor. Unsure how long she was out. - had been doing some outdoor work that day in heat - 12 oz water x 6-8 times per day.    No past medical history on file.   Allergies  Allergen Reactions  . Darvon [Propoxyphene] Rash and Other (See Comments)    Swelling, angioedema, shortness of breath  . Hydrocodone Rash and Other (See Comments)    Swelling, angioedema, shortness of breath  . Morphine And Related Rash and Other (See Comments)    Swelling, angioedema, shortness of breath  . Oxycodone Rash and Other (See Comments)    Swelling, angioedema, shortness of breath  . Cephalosporins   . Codeine   . Morphine   . Nsaids   . Penicillamine   . Penicillins     REACTION: Rash  . Salicylates   . Penicillins Itching and Rash     Current Outpatient Medications  Medication Sig Dispense Refill  . aspirin EC 81 MG tablet Take 1 tablet (81 mg total) by mouth daily. 30 tablet 1  . atorvastatin (LIPITOR) 40 MG tablet Take 1 tablet (40 mg total) by mouth daily. 30 tablet 1  . Cyanocobalamin (VITAMIN B-12 SL) Place 2 tablets under the tongue once.    . montelukast (SINGULAIR) 10 MG tablet Take 10 mg by mouth at bedtime.  1  . PROAIR HFA 108 (90 Base) MCG/ACT  inhaler Inhale 2 puffs into the lungs daily as needed for shortness of breath.  4  . propranolol (INDERAL) 40 MG tablet Take 40 mg by mouth 2 (two) times daily.  1   No current facility-administered medications for this visit.     Past Surgical History:  Procedure Laterality Date  . CHOLECYSTECTOMY    . DILATION AND CURETTAGE OF UTERUS    . HAND SURGERY Right   . KNEE ARTHROSCOPY Left   . TUBAL LIGATION       Allergies  Allergen Reactions  . Darvon [Propoxyphene] Rash and Other (See Comments)    Swelling, angioedema, shortness of breath  . Hydrocodone Rash and Other (See Comments)    Swelling, angioedema, shortness of breath  . Morphine And Related Rash and Other (See Comments)    Swelling, angioedema, shortness of breath  . Oxycodone Rash and Other (See Comments)    Swelling, angioedema, shortness of breath  . Cephalosporins   . Codeine   . Morphine   . Nsaids   . Penicillamine   . Penicillins     REACTION: Rash  . Salicylates   . Penicillins Itching and Rash      No family history on file.   Social History Ms. Millman reports that she has never smoked. She  has never used smokeless tobacco. Ms. Ector reports no history of alcohol use.   Review of Systems CONSTITUTIONAL: No weight loss, fever, chills, weakness or fatigue.  HEENT: Eyes: No visual loss, blurred vision, double vision or yellow sclerae.No hearing loss, sneezing, congestion, runny nose or sore throat.  SKIN: No rash or itching.  CARDIOVASCULAR: per hpi RESPIRATORY: No shortness of breath, cough or sputum.  GASTROINTESTINAL: No anorexia, nausea, vomiting or diarrhea. No abdominal pain or blood.  GENITOURINARY: No burning on urination, no polyuria NEUROLOGICAL: No headache, dizziness, syncope, paralysis, ataxia, numbness or tingling in the extremities. No change in bowel or bladder control.  MUSCULOSKELETAL: No muscle, back pain, joint pain or stiffness.  LYMPHATICS: No enlarged nodes. No history  of splenectomy.  PSYCHIATRIC: No history of depression or anxiety.  ENDOCRINOLOGIC: No reports of sweating, cold or heat intolerance. No polyuria or polydipsia.  Marland Kitchen   Physical Examination Today's Vitals   05/25/20 1055  BP: 122/68  Pulse: 88  SpO2: 97%  Weight: 158 lb (71.7 kg)  Height: 5\' 7"  (1.702 m)   Body mass index is 24.75 kg/m.  Gen: resting comfortably, no acute distress HEENT: no scleral icterus, pupils equal round and reactive, no palptable cervical adenopathy,  CV: RRR, no m/r/g no jvd Resp: Clear to auscultation bilaterally GI: abdomen is soft, non-tender, non-distended, normal bowel sounds, no hepatosplenomegaly MSK: extremities are warm, no edema.  Skin: warm, no rash Neuro:  no focal deficits Psych: appropriate affect     Assessment and Plan  1. Chest pain - unclear etiology, ongoign since her ER visit in June with some progression - she is not comfortable running on treadmill. We discussed a medication stress test, given her multiple medication allergies she is concerned about this option. We discussed a coronary CTA which she would be more in favor of - would obtain echo initially, then likely coronary CTA  2. Syncope - isolated episode after working outside in the heat for a few hours. Interestingly denies any prodrome - initial workup with echo, pending results could consider outpatient monitor      July, M.D.

## 2020-05-27 ENCOUNTER — Other Ambulatory Visit: Payer: Self-pay

## 2020-05-27 ENCOUNTER — Ambulatory Visit (HOSPITAL_COMMUNITY)
Admission: RE | Admit: 2020-05-27 | Discharge: 2020-05-27 | Disposition: A | Discharge status: Home / Self Care | Payer: 59 | Source: Ambulatory Visit | Attending: Cardiology | Admitting: Cardiology

## 2020-05-27 DIAGNOSIS — R079 Chest pain, unspecified: Secondary | ICD-10-CM | POA: Diagnosis present

## 2020-05-27 LAB — ECHOCARDIOGRAM COMPLETE
AR max vel: 2.2 cm2
AV Area VTI: 2.34 cm2
AV Area mean vel: 2.15 cm2
AV Mean grad: 3.6 mmHg
AV Peak grad: 6.3 mmHg
Ao pk vel: 1.26 m/s
Area-P 1/2: 2.69 cm2
S' Lateral: 2.69 cm

## 2020-05-27 NOTE — Progress Notes (Signed)
*  PRELIMINARY RESULTS* Echocardiogram 2D Echocardiogram has been performed.  Stacey Drain 05/27/2020, 12:28 PM

## 2020-06-02 ENCOUNTER — Telehealth: Payer: Self-pay | Admitting: Cardiology

## 2020-06-02 DIAGNOSIS — R079 Chest pain, unspecified: Secondary | ICD-10-CM

## 2020-06-02 NOTE — Telephone Encounter (Signed)
Just resulted   J Chrissy Ealey MD 

## 2020-06-02 NOTE — Telephone Encounter (Signed)
Echo done 8/11 - f/u pending

## 2020-06-02 NOTE — Telephone Encounter (Signed)
Patient came into office wanting to know results from her Echo and when her f/u would be.

## 2020-06-03 NOTE — Telephone Encounter (Signed)
Normal echo. Can we order a coronary CTA for chest pain as we had discussed in clinic. F/u PA 6 weeks   Dominga Ferry MD

## 2020-06-03 NOTE — Telephone Encounter (Signed)
Pt aware and agreeable for coronary ct - orders placed and will forward to schedulers

## 2020-06-23 ENCOUNTER — Telehealth: Payer: Self-pay | Admitting: Cardiology

## 2020-06-23 NOTE — Telephone Encounter (Signed)
Could do either an exericse myoview of lexiscan depending on her comfort on a treadmill  Dominga Ferry MD

## 2020-06-23 NOTE — Telephone Encounter (Signed)
Pt aware and says she will think about stress test and call us back

## 2020-06-23 NOTE — Telephone Encounter (Signed)
Patient called stating that she continues to have chest pains. Her insurance [facility]) is still pending for the CT Coronary. Patient is wanting to know if another test can be ordered. [phone]

## 2020-06-24 ENCOUNTER — Telehealth: Payer: Self-pay | Admitting: Cardiology

## 2020-06-24 DIAGNOSIS — R079 Chest pain, unspecified: Secondary | ICD-10-CM

## 2020-06-24 NOTE — Telephone Encounter (Signed)
Patient states she was told she needed lab orders placed to check her Kidney Functions so she can have a CT scan next week.

## 2020-06-24 NOTE — Telephone Encounter (Signed)
Pt aware to have BMP prior to CT - says she will have done 9/13 at [facility] - orders placed - pt wanted to make Dr Wyline Mood aware that pcp gave her cyclobenzaprine 10 mg as needed and says this has helped with her chest pain - instructions for CT given and voiced understanding

## 2020-06-24 NOTE — Telephone Encounter (Signed)
Patient would like to speak with a nurse regarding medication that PCP provided. Thinks it may be contributing to her chest pain.  Patient would like a copy of her most recent lab results mailed to her home address.

## 2020-06-26 NOTE — Telephone Encounter (Signed)
Would still recommend CT to be sure her heart is not the cause  J Tashiana Lamarca MD

## 2020-06-29 ENCOUNTER — Telehealth (HOSPITAL_COMMUNITY): Payer: Self-pay | Admitting: Emergency Medicine

## 2020-06-29 ENCOUNTER — Other Ambulatory Visit: Payer: Self-pay

## 2020-06-29 ENCOUNTER — Other Ambulatory Visit (HOSPITAL_COMMUNITY)
Admission: RE | Admit: 2020-06-29 | Discharge: 2020-06-29 | Disposition: A | Discharge status: Home / Self Care | Payer: 59 | Source: Ambulatory Visit | Attending: Cardiology | Admitting: Cardiology

## 2020-06-29 DIAGNOSIS — R079 Chest pain, unspecified: Secondary | ICD-10-CM | POA: Diagnosis present

## 2020-06-29 LAB — BASIC METABOLIC PANEL
Anion gap: 10 (ref 5–15)
BUN: 15 mg/dL (ref 8–23)
CO2: 26 mmol/L (ref 22–32)
Calcium: 9.3 mg/dL (ref 8.9–10.3)
Chloride: 101 mmol/L (ref 98–111)
Creatinine, Ser: 0.85 mg/dL (ref 0.44–1.00)
GFR calc Af Amer: >60 mL/min (ref >60)
GFR calc non Af Amer: >60 mL/min (ref >60)
Glucose, Bld: 99 mg/dL (ref 70–99)
Potassium: 4.3 mmol/L (ref 3.5–5.1)
Sodium: 137 mmol/L (ref 135–145)

## 2020-06-29 NOTE — Telephone Encounter (Signed)
Got a message from scheduling stating patient called with questions about her upcoming CCTA.   Attempted to call her back but got her VM.  LMTCB  Rockwell Alexandria RN Navigator Cardiac Imaging [facility] [phone] Office  [phone] Cell

## 2020-07-01 ENCOUNTER — Telehealth (HOSPITAL_COMMUNITY): Payer: Self-pay | Admitting: Emergency Medicine

## 2020-07-01 NOTE — Telephone Encounter (Signed)
Reaching out to patient to offer assistance regarding upcoming cardiac imaging study; pt verbalizes understanding of appt date/time, parking situation and where to check in, pre-test NPO status and medications ordered, and verified current allergies; name and call back number provided for further questions should they arise Rockwell Alexandria RN Navigator Cardiac Imaging [facility] [phone] office [phone] cell   Informed patient about the HR requirements for CCTA. She states she does not like doctors/offices/hopitals, that they make her nervous. I told her I was waiting to hear from Dr Wyline Mood regarding possible HR controlling medications such as metoprolol and she states shes already taking propanolol. Huntley Dec

## 2020-07-01 NOTE — Telephone Encounter (Signed)
Attempted to call patient regarding upcoming cardiac CT appointment. Left message on voicemail with name and callback number Rockwell Alexandria RN Navigator Cardiac Imaging [facility] [phone] Office [phone] Cell   Sent message to Dr Wyline Mood about requesting a dose of metoprolol for HR control. Awaiting response Huntley Dec

## 2020-07-02 MED ORDER — METOPROLOL TARTRATE 100 MG PO TABS: 100.0000 mg | ORAL_TABLET | Freq: Once | ORAL | 0 refills | Status: DC

## 2020-07-02 NOTE — Progress Notes (Signed)
Yes ordering the lopressor dose would be fine   Dominga Ferry MD

## 2020-07-02 NOTE — Addendum Note (Signed)
Addended by: Lennie Odor on: 07/02/2020 11:47 AM   Modules accepted: Orders

## 2020-07-02 NOTE — Progress Notes (Signed)
One time dose metoprolol prescribed for HR control for tomorrow CCTA appt.  Called patient and LMTCB and detailed message about medication.  Rockwell Alexandria RN Navigator Cardiac Imaging [facility] [phone] Office  [phone] Cell

## 2020-07-03 ENCOUNTER — Ambulatory Visit (HOSPITAL_COMMUNITY)
Admission: RE | Admit: 2020-07-03 | Discharge: 2020-07-03 | Disposition: A | Discharge status: Home / Self Care | Payer: 59 | Source: Ambulatory Visit | Attending: Cardiology | Admitting: Cardiology

## 2020-07-03 ENCOUNTER — Other Ambulatory Visit: Payer: Self-pay

## 2020-07-03 DIAGNOSIS — R079 Chest pain, unspecified: Secondary | ICD-10-CM | POA: Insufficient documentation

## 2020-07-03 MED ORDER — ONDANSETRON HCL 4 MG/2ML IJ SOLN: 4.0000 mg | Freq: Once | INTRAMUSCULAR | Status: AC

## 2020-07-03 MED ORDER — ONDANSETRON HCL 4 MG/2ML IJ SOLN
INTRAMUSCULAR | Status: AC
  Administered 2020-07-03: 4 mg via INTRAVENOUS
  Filled 2020-07-03: qty 2

## 2020-07-03 MED ORDER — SODIUM CHLORIDE 0.9 % IV BOLUS
500.0000 mL | Freq: Once | INTRAVENOUS | Status: AC
  Administered 2020-07-03: 500 mL via INTRAVENOUS

## 2020-07-03 MED ORDER — NITROGLYCERIN 0.4 MG SL SUBL: 0.8000 mg | SUBLINGUAL_TABLET | Freq: Once | SUBLINGUAL | Status: AC

## 2020-07-03 MED ORDER — IOHEXOL 350 MG/ML SOLN
80.0000 mL | Freq: Once | INTRAVENOUS | Status: AC | PRN
  Administered 2020-07-03: 80 mL via INTRAVENOUS

## 2020-07-03 MED ORDER — NITROGLYCERIN 0.4 MG SL SUBL
SUBLINGUAL_TABLET | SUBLINGUAL | Status: AC
  Administered 2020-07-03: 0.8 mg via SUBLINGUAL
  Filled 2020-07-03: qty 2

## 2020-07-03 NOTE — Progress Notes (Signed)
Patient able to tolerate liquids without nausea or vomiting. PIV D/C'd and patient discharged home feeling better.

## 2020-07-03 NOTE — Progress Notes (Signed)
Post CTA Coronary/CP patient developed nausea, vomiting and headache. Patient unable to tolerate ginger ale and cookies.Order for Zofran and NS bolus obtained from Dr. Val EagleJennette Kettle which were administered to patient.

## 2020-08-18 ENCOUNTER — Other Ambulatory Visit: Payer: 59

## 2020-08-18 ENCOUNTER — Other Ambulatory Visit: Payer: Self-pay

## 2020-08-18 DIAGNOSIS — Z20822 Contact with and (suspected) exposure to covid-19: Secondary | ICD-10-CM

## 2020-08-19 LAB — NOVEL CORONAVIRUS, NAA: SARS-CoV-2, NAA: NOT DETECTED

## 2020-08-19 LAB — SPECIMEN STATUS REPORT

## 2020-08-19 LAB — SARS-COV-2, NAA 2 DAY TAT

## 2020-08-29 ENCOUNTER — Other Ambulatory Visit: Payer: Self-pay

## 2020-08-29 ENCOUNTER — Ambulatory Visit
Admission: EM | Admit: 2020-08-29 | Discharge: 2020-08-29 | Disposition: A | Discharge status: Home / Self Care | Payer: 59 | Attending: Emergency Medicine | Admitting: Emergency Medicine

## 2020-08-29 DIAGNOSIS — R059 Cough, unspecified: Secondary | ICD-10-CM

## 2020-08-29 DIAGNOSIS — Z1152 Encounter for screening for COVID-19: Secondary | ICD-10-CM

## 2020-08-29 DIAGNOSIS — R6889 Other general symptoms and signs: Secondary | ICD-10-CM

## 2020-08-29 MED ORDER — DEXAMETHASONE SODIUM PHOSPHATE 10 MG/ML IJ SOLN
10.0000 mg | Freq: Once | INTRAMUSCULAR | Status: AC
  Administered 2020-08-29: 10 mg via INTRAMUSCULAR

## 2020-08-29 MED ORDER — DOXYCYCLINE HYCLATE 100 MG PO CAPS
100.0000 mg | ORAL_CAPSULE | Freq: Two times a day (BID) | ORAL | 0 refills | Status: DC
Start: 2020-08-29 — End: 2021-07-08

## 2020-08-29 MED ORDER — BENZONATATE 100 MG PO CAPS
100.0000 mg | ORAL_CAPSULE | Freq: Three times a day (TID) | ORAL | 0 refills | Status: DC
Start: 2020-08-29 — End: 2021-07-08

## 2020-08-29 MED ORDER — PREDNISONE 10 MG (21) PO TBPK
ORAL_TABLET | Freq: Every day | ORAL | 0 refills | Status: DC
Start: 2020-08-29 — End: 2022-10-26

## 2020-08-29 NOTE — Discharge Instructions (Addendum)
Unable to rule out  blood clot in urgent care setting.  Offered patient further evaluation and management in the ED.  Patient declines at this time and would like to try outpatient therapy first.  Aware of the risk associated with this decision including missed diagnosis, organ damage, organ failure, and/or death.  Patient aware and in agreement.     COVID testing ordered.  It will take between 5-7 days for test results.  Someone will contact you regarding abnormal results.    In the meantime: You should remain isolated in your home for 10 days from symptom onset AND greater than 72 hours after symptoms resolution (absence of fever without the use of fever-reducing medication and improvement in respiratory symptoms), whichever is longer Get plenty of rest and push fluids Steroid shot given in office Tessalon Perles prescribed for cough Prednisone prescribed Doxycycline for sinus infection Use OTC zyrtec for nasal congestion, runny nose, and/or sore throat Use OTC flonase for nasal congestion and runny nose Use medications daily for symptom relief Use OTC medications like ibuprofen or tylenol as needed fever or pain Call or go to the ED if you have any new or worsening symptoms such as fever, worsening cough, shortness of breath, chest tightness, chest pain, turning blue, changes in mental status, etc..Marland Kitchen

## 2020-08-29 NOTE — ED Triage Notes (Signed)
Pt presents with c/o cough for past week also has had diarrhea and multiple complaints

## 2020-08-29 NOTE — ED Provider Notes (Signed)
[facility]   [id number] 08/29/20 Arrival Time: 1447   CC: COVID symptoms  SUBJECTIVE: History from: patient.  Savannah Trevino is a 59 y.o. female who presents with cough and diarrhea x 1 week.  Mother with covid.  Denies alleviating or aggravating factors. Denies fever, chills, SOB, wheezing, chest pain, nausea, changes in bladder habits.    ROS: As per HPI.  All other pertinent ROS negative.     History reviewed. No pertinent past medical history. Past Surgical History:  Procedure Laterality Date  . CHOLECYSTECTOMY    . DILATION AND CURETTAGE OF UTERUS    . HAND SURGERY Right   . KNEE ARTHROSCOPY Left   . TUBAL LIGATION     Allergies  Allergen Reactions  . Darvon [Propoxyphene] Rash and Other (See Comments)    Swelling, angioedema, shortness of breath  . Hydrocodone Rash and Other (See Comments)    Swelling, angioedema, shortness of breath  . Morphine And Related Rash and Other (See Comments)    Swelling, angioedema, shortness of breath  . Oxycodone Rash and Other (See Comments)    Swelling, angioedema, shortness of breath  . Bactrim [Sulfamethoxazole-Trimethoprim]     Bleeding   . Cephalosporins   . Codeine   . Morphine   . Nsaids   . Penicillamine   . Penicillins     REACTION: Rash  . Salicylates   . Penicillins Itching and Rash   No current facility-administered medications on file prior to encounter.   Current Outpatient Medications on File Prior to Encounter  Medication Sig Dispense Refill  . aspirin EC 81 MG tablet Take 1 tablet (81 mg total) by mouth daily. (Patient taking differently: Take 81 mg by mouth as needed. ) 30 tablet 1  . cyclobenzaprine (FLEXERIL) 10 MG tablet Take 10 mg by mouth at bedtime as needed.    Marland Kitchen ibuprofen (ADVIL) 200 MG tablet Take 200 mg by mouth every 6 (six) hours as needed for headache, mild pain or moderate pain.    . metoprolol tartrate (LOPRESSOR) 100 MG tablet Take 1 tablet (100 mg total) by mouth once for 1  dose. Please take one time dose 100mg  metoprolol tartrate 2 hr prior to cardiac CT for HR control IF HR >55bpm. 1 tablet 0  . montelukast (SINGULAIR) 10 MG tablet Take 10 mg by mouth as needed.   1  . omeprazole (PRILOSEC) 20 MG capsule Take 20 mg by mouth as needed.    PROAIR HFA 108 (90 Base) MCG/ACT inhaler Inhale 2 puffs into the lungs daily as needed for shortness of breath.  4  . promethazine (PHENERGAN) 25 MG tablet as needed.    . propranolol (INDERAL) 40 MG tablet Take 40 mg by mouth 2 (two) times daily.  1  . SUMAtriptan (IMITREX) 100 MG tablet Take 100 mg by mouth 3 (three) times daily as needed.     Social History   Socioeconomic History  . Marital status: Single    Spouse name: Not on file  . Number of children: Not on file  . Years of education: Not on file  . Highest education level: Not on file  Occupational History  . Not on file  Tobacco Use  . Smoking status: Never Smoker  . Smokeless tobacco: Never Used  Substance and Sexual Activity  . Alcohol use: No  . Drug use: No  . Sexual activity: Not on file  Other Topics Concern  . Not on file  Social History Narrative  .  Not on file   Social Determinants of Health   Financial Resource Strain:   . Difficulty of Paying Living Expenses: Not on file  Food Insecurity:   . Worried About Programme researcher, broadcasting/film/video in the Last Year: Not on file  . Ran Out of Food in the Last Year: Not on file  Transportation Needs:   . Lack of Transportation (Medical): Not on file  . Lack of Transportation (Non-Medical): Not on file  Physical Activity:   . Days of Exercise per Week: Not on file  . Minutes of Exercise per Session: Not on file  Stress:   . Feeling of Stress : Not on file  Social Connections:   . Frequency of Communication with Friends and Family: Not on file  . Frequency of Social Gatherings with Friends and Family: Not on file  . Attends Religious Services: Not on file  . Active Member of Clubs or Organizations: Not on  file  . Attends Banker Meetings: Not on file  . Marital Status: Not on file  Intimate Partner Violence:   . Fear of Current or Ex-Partner: Not on file  . Emotionally Abused: Not on file  . Physically Abused: Not on file  . Sexually Abused: Not on file   Family History  Family history unknown: Yes    OBJECTIVE:  Vitals:   08/29/20 1457  BP: 97/63  Pulse: (!) 118  Resp: 20  Temp: 98.7 F (37.1 C)  SpO2: 98%    Exam limited due to patient's noncompliance to wear mask  General appearance: alert; appears fatigued, but nontoxic HEENT: NCAT; Ears: EACs clear, TMs pearly gray; Eyes: PERRL.  EOM grossly intact. Nose: nares patent without rhinorrhea, Throat: oropharynx clear, tonsils non erythematous or enlarged, uvula midline  Neck: supple without LAD Lungs: cough: moderate; unable to assess breath sounds due to cough; takes mask off during exam, noncompliant with wearing mask during exam Heart: unable to assess due to persistent cough Skin: warm and dry Psychological: alert and cooperative; normal mood and affect  ASSESSMENT & PLAN:  1. Encounter for screening for COVID-19   2. Cough   3. Flu-like symptoms     Meds ordered this encounter  Medications  . benzonatate (TESSALON) 100 MG capsule    Sig: Take 1 capsule (100 mg total) by mouth every 8 (eight) hours.    Dispense:  21 capsule    Refill:  0    Order Specific Question:   Supervising Provider    Answer:   Eustace Moore [[id number]]  . doxycycline (VIBRAMYCIN) 100 MG capsule    Sig: Take 1 capsule (100 mg total) by mouth 2 (two) times daily.    Dispense:  20 capsule    Refill:  0    Order Specific Question:   Supervising Provider    Answer:   Eustace Moore [[id number]]  . predniSONE (STERAPRED UNI-PAK 21 TAB) 10 MG (21) TBPK tablet    Sig: Take by mouth daily. Take 6 tabs by mouth daily  for 2 days, then 5 tabs for 2 days, then 4 tabs for 2 days, then 3 tabs for 2 days, 2 tabs for 2 days, then 1  tab by mouth daily for 2 days    Dispense:  42 tablet    Refill:  0    Order Specific Question:   Supervising Provider    Answer:   Eustace Moore [[id number]]  . dexamethasone (DECADRON) injection 10 mg   Unable to  rule out  blood clot in urgent care setting.  Offered patient further evaluation and management in the ED.  Patient declines at this time and would like to try outpatient therapy first.  Aware of the risk associated with this decision including missed diagnosis, organ damage, organ failure, and/or death.  Patient aware and in agreement.     COVID testing ordered.  It will take between 5-7 days for test results.  Someone will contact you regarding abnormal results.    In the meantime: You should remain isolated in your home for 10 days from symptom onset AND greater than 72 hours after symptoms resolution (absence of fever without the use of fever-reducing medication and improvement in respiratory symptoms), whichever is longer Get plenty of rest and push fluids Steroid shot given in office Tessalon Perles prescribed for cough Prednisone prescribed Doxycycline for sinus infection Use OTC zyrtec for nasal congestion, runny nose, and/or sore throat Use OTC flonase for nasal congestion and runny nose Use medications daily for symptom relief Use OTC medications like ibuprofen or tylenol as needed fever or pain Call or go to the ED if you have any new or worsening symptoms such as fever, worsening cough, shortness of breath, chest tightness, chest pain, turning blue, changes in mental status, etc...   Reviewed expectations re: course of current medical issues. Questions answered. Outlined signs and symptoms indicating need for more acute intervention. Patient verbalized understanding. After Visit Summary given.         Rennis Harding, PA-C 08/29/20 1520

## 2020-08-30 LAB — NOVEL CORONAVIRUS, NAA: SARS-CoV-2, NAA: DETECTED — AB

## 2020-08-30 LAB — SARS-COV-2, NAA 2 DAY TAT

## 2020-08-31 ENCOUNTER — Telehealth: Payer: Self-pay | Admitting: Nurse Practitioner

## 2020-08-31 DIAGNOSIS — U071 COVID-19: Secondary | ICD-10-CM

## 2020-08-31 NOTE — Telephone Encounter (Signed)
Called to discuss with Lowella Grip about Covid symptoms and the use of a monoclonal antibody infusion for those with mild to moderate Covid symptoms and at a high risk of hospitalization.     Pt does not qualify for infusion therapy given symptoms first presented > 10 days prior to timing of infusion. Symptoms tier reviewed as well as criteria for ending isolation. Preventative practices reviewed. Patient verbalized understanding  Patient Active Problem List   Diagnosis Date Noted  . Complicated migraine   . TIA (transient ischemic attack) 10/15/2016  . RECTAL POLYPS 02/11/2009  . HEADACHE 01/28/2009  . CHEST PAIN, RIGHT 12/17/2008  . BENIGN POSITIONAL VERTIGO 09/17/2008  . HYPERLIPIDEMIA 11/13/2007  . NIPPLE DISCHARGE 11/13/2007  . NECK PAIN 11/12/2007  . INSOMNIA 01/16/2007  . ANXIETY 12/05/2006  . TOBACCO USER 12/05/2006  . DEPRESSION 12/05/2006  . COMMON MIGRAINE 12/05/2006  . ALLERGIC RHINITIS 12/05/2006  . ASTHMA 12/05/2006  . GERD 12/05/2006  . OVERACTIVE BLADDER 12/05/2006  . ARTHRITIS 12/05/2006   Consuello Masse, NP [phone] [email or web address]

## 2021-02-03 ENCOUNTER — Other Ambulatory Visit (HOSPITAL_COMMUNITY): Payer: Self-pay | Admitting: Family Medicine

## 2021-02-03 DIAGNOSIS — L989 Disorder of the skin and subcutaneous tissue, unspecified: Secondary | ICD-10-CM

## 2021-02-10 ENCOUNTER — Ambulatory Visit (HOSPITAL_COMMUNITY)
Admission: RE | Admit: 2021-02-10 | Discharge: 2021-02-10 | Disposition: A | Discharge status: Home / Self Care | Payer: 59 | Source: Ambulatory Visit | Attending: Family Medicine | Admitting: Family Medicine

## 2021-02-10 ENCOUNTER — Other Ambulatory Visit: Payer: Self-pay

## 2021-02-10 DIAGNOSIS — L989 Disorder of the skin and subcutaneous tissue, unspecified: Secondary | ICD-10-CM | POA: Insufficient documentation

## 2021-02-11 ENCOUNTER — Other Ambulatory Visit (HOSPITAL_COMMUNITY): Payer: Self-pay | Admitting: Family Medicine

## 2021-02-11 ENCOUNTER — Other Ambulatory Visit: Payer: Self-pay | Admitting: Family Medicine

## 2021-02-11 DIAGNOSIS — L989 Disorder of the skin and subcutaneous tissue, unspecified: Secondary | ICD-10-CM

## 2021-02-11 DIAGNOSIS — Z1231 Encounter for screening mammogram for malignant neoplasm of breast: Secondary | ICD-10-CM

## 2021-03-01 ENCOUNTER — Ambulatory Visit (HOSPITAL_COMMUNITY): Payer: 59

## 2021-07-08 ENCOUNTER — Emergency Department (HOSPITAL_COMMUNITY)
Admission: EM | Admit: 2021-07-08 | Discharge: 2021-07-08 | Disposition: A | Discharge status: Home / Self Care | Payer: 59 | Attending: Emergency Medicine | Admitting: Emergency Medicine

## 2021-07-08 ENCOUNTER — Emergency Department (HOSPITAL_COMMUNITY): Payer: 59

## 2021-07-08 ENCOUNTER — Other Ambulatory Visit: Payer: Self-pay

## 2021-07-08 ENCOUNTER — Encounter (HOSPITAL_COMMUNITY): Payer: Self-pay

## 2021-07-08 DIAGNOSIS — Y92410 Unspecified street and highway as the place of occurrence of the external cause: Secondary | ICD-10-CM | POA: Insufficient documentation

## 2021-07-08 DIAGNOSIS — S299XXA Unspecified injury of thorax, initial encounter: Secondary | ICD-10-CM | POA: Diagnosis present

## 2021-07-08 DIAGNOSIS — S20219A Contusion of unspecified front wall of thorax, initial encounter: Secondary | ICD-10-CM | POA: Insufficient documentation

## 2021-07-08 DIAGNOSIS — Z79899 Other long term (current) drug therapy: Secondary | ICD-10-CM | POA: Insufficient documentation

## 2021-07-08 DIAGNOSIS — Z96652 Presence of left artificial knee joint: Secondary | ICD-10-CM | POA: Diagnosis not present

## 2021-07-08 DIAGNOSIS — J45909 Unspecified asthma, uncomplicated: Secondary | ICD-10-CM | POA: Diagnosis not present

## 2021-07-08 DIAGNOSIS — R519 Headache, unspecified: Secondary | ICD-10-CM | POA: Insufficient documentation

## 2021-07-08 LAB — CBC WITH DIFFERENTIAL/PLATELET
Abs Immature Granulocytes: 0.05 10*3/uL (ref 0.00–0.07)
Basophils Absolute: 0.1 10*3/uL (ref 0.0–0.1)
Basophils Relative: 1 %
Eosinophils Absolute: 0.1 10*3/uL (ref 0.0–0.5)
Eosinophils Relative: 1 %
HCT: 41.9 % (ref 36.0–46.0)
Hemoglobin: 13.8 g/dL (ref 12.0–15.0)
Immature Granulocytes: 1 %
Lymphocytes Relative: 21 %
Lymphs Abs: 1.8 10*3/uL (ref 0.7–4.0)
MCH: 29.5 pg (ref 26.0–34.0)
MCHC: 32.9 g/dL (ref 30.0–36.0)
MCV: 89.5 fL (ref 80.0–100.0)
Monocytes Absolute: 0.8 10*3/uL (ref 0.1–1.0)
Monocytes Relative: 10 %
Neutro Abs: 6 10*3/uL (ref 1.7–7.7)
Neutrophils Relative %: 66 %
Platelets: 241 10*3/uL (ref 150–400)
RBC: 4.68 MIL/uL (ref 3.87–5.11)
RDW: 12.5 % (ref 11.5–15.5)
WBC: 8.9 10*3/uL (ref 4.0–10.5)
nRBC: 0 % (ref 0.0–0.2)

## 2021-07-08 LAB — COMPREHENSIVE METABOLIC PANEL
ALT: 40 U/L (ref 0–44)
AST: 40 U/L (ref 15–41)
Albumin: 4.4 g/dL (ref 3.5–5.0)
Alkaline Phosphatase: 75 U/L (ref 38–126)
Anion gap: 9 (ref 5–15)
BUN: 20 mg/dL (ref 8–23)
CO2: 25 mmol/L (ref 22–32)
Calcium: 9 mg/dL (ref 8.9–10.3)
Chloride: 103 mmol/L (ref 98–111)
Creatinine, Ser: 0.82 mg/dL (ref 0.44–1.00)
GFR, Estimated: >60 mL/min (ref >60)
Glucose, Bld: 106 mg/dL — ABNORMAL HIGH (ref 70–99)
Potassium: 3.5 mmol/L (ref 3.5–5.1)
Sodium: 137 mmol/L (ref 135–145)
Total Bilirubin: 0.4 mg/dL (ref 0.3–1.2)
Total Protein: 8.1 g/dL (ref 6.5–8.1)

## 2021-07-08 LAB — TYPE AND SCREEN
ABO/RH(D): AB NEG
Antibody Screen: NEGATIVE

## 2021-07-08 LAB — PROTIME-INR
INR: 0.9 (ref 0.8–1.2)
Prothrombin Time: 11.8 seconds (ref 11.4–15.2)

## 2021-07-08 MED ORDER — ACETAMINOPHEN 500 MG PO TABS
1000.0000 mg | ORAL_TABLET | Freq: Once | ORAL | Status: AC
  Administered 2021-07-08: 1000 mg via ORAL
  Filled 2021-07-08: qty 2

## 2021-07-08 MED ORDER — IOHEXOL 300 MG/ML  SOLN: 100.0000 mL | Freq: Once | INTRAMUSCULAR | Status: DC | PRN

## 2021-07-08 NOTE — ED Notes (Signed)
Patient transported to X-ray-informed tech to take pt to room 6 after xray completed.
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
## 2021-07-08 NOTE — ED Notes (Signed)
EKG given to Dr Hyacinth Meeker- informed Dr about pt condition
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
## 2022-10-26 ENCOUNTER — Ambulatory Visit
Admission: EM | Admit: 2022-10-26 | Discharge: 2022-10-26 | Disposition: A | Discharge status: Home / Self Care | Payer: 59 | Attending: Nurse Practitioner | Admitting: Nurse Practitioner

## 2022-10-26 DIAGNOSIS — J0110 Acute frontal sinusitis, unspecified: Secondary | ICD-10-CM

## 2022-10-26 MED ORDER — FLUTICASONE PROPIONATE 50 MCG/ACT NA SUSP: 1.0000 | Freq: Every day | NASAL | 0 refills | Status: AC

## 2022-10-26 MED ORDER — BENZONATATE 100 MG PO CAPS: 100.0000 mg | ORAL_CAPSULE | Freq: Three times a day (TID) | ORAL | 0 refills | Status: DC | PRN

## 2022-10-26 MED ORDER — DOXYCYCLINE HYCLATE 100 MG PO CAPS: 100.0000 mg | ORAL_CAPSULE | Freq: Two times a day (BID) | ORAL | 0 refills | Status: AC

## 2022-10-26 NOTE — Discharge Instructions (Addendum)
I am suspicious you may have a sinus infection.  This can cause the cough and the postnasal drainage.  Please start Flonase to help with the postnasal drainage and Tessalon Perles to help treat the cough.  Please also start the doxycycline to treat the bacteria in your sinuses.  Continue the breathing treatments.   Follow-up with your primary care provider if your symptoms persist or worsen despite treatment.

## 2022-10-26 NOTE — ED Triage Notes (Signed)
Pt reports cough x 3 months. OTC meds gives no relief.

## 2022-10-26 NOTE — ED Provider Notes (Signed)
[facility]    CSN: [id number] Arrival date & time: 10/26/22  1553      History   Chief Complaint Chief Complaint  Patient presents with   Cough    HPI Savannah Trevino is a 62 y.o. female.   Patient presents today for reported 12-month history of congested cough.  Also endorses "on and off fevers, chest pain after coughing, chest tightness and chest congestion, runny nose, sore throat from coughing, headache from coughing.  She denies shortness of breath, ear pain, abdominal pain, nausea/vomiting, decreased appetite, and fatigue.  Reports has also had diarrhea on and off since symptoms started.  Has been taking Sudafed, Alka-Seltzer cold and flu, and trying warm teas, salt water gargles without much benefit.  No known sick contacts.  Patient reports history of asthma, she has a rescue inhaler has been using it more frequently than normal, reports it helps when she takes it.  Also has nebulizer machine with albuterol that helps when she takes it.  Patient denies smoking or vaping history.     History reviewed. No pertinent past medical history.  Patient Active Problem List   Diagnosis Date Noted   Complicated migraine    TIA (transient ischemic attack) 10/15/2016   RECTAL POLYPS 02/11/2009   HEADACHE 01/28/2009   CHEST PAIN, RIGHT 12/17/2008   BENIGN POSITIONAL VERTIGO 09/17/2008   HYPERLIPIDEMIA 11/13/2007   NIPPLE DISCHARGE 11/13/2007   NECK PAIN 11/12/2007   INSOMNIA 01/16/2007   ANXIETY 12/05/2006   TOBACCO USER 12/05/2006   DEPRESSION 12/05/2006   COMMON MIGRAINE 12/05/2006   ALLERGIC RHINITIS 12/05/2006   ASTHMA 12/05/2006   GERD 12/05/2006   OVERACTIVE BLADDER 12/05/2006   ARTHRITIS 12/05/2006    Past Surgical History:  Procedure Laterality Date   CHOLECYSTECTOMY     DILATION AND CURETTAGE OF UTERUS     HAND SURGERY Right    KNEE ARTHROSCOPY Left    TUBAL LIGATION      OB History   No obstetric history on file.      Home Medications     Prior to Admission medications   Medication Sig Start Date End Date Taking? Authorizing Provider  benzonatate (TESSALON) 100 MG capsule Take 1 capsule (100 mg total) by mouth 3 (three) times daily as needed for cough. Do not take with alcohol or while driving or operating heavy machinery.  May cause drowsiness. 10/26/22  Yes Valentino Nose, NP  doxycycline (VIBRAMYCIN) 100 MG capsule Take 1 capsule (100 mg total) by mouth 2 (two) times daily for 7 days. 10/26/22 11/02/22 Yes Valentino Nose, NP  fluticasone (FLONASE) 50 MCG/ACT nasal spray Place 1 spray into both nostrils daily. 10/26/22  Yes Valentino Nose, NP  aspirin EC 81 MG tablet Take 1 tablet (81 mg total) by mouth daily. Patient not taking: Reported on 07/08/2021 10/16/16   Leatha Gilding, MD  cyclobenzaprine (FLEXERIL) 10 MG tablet Take 10 mg by mouth at bedtime as needed. 04/16/20   [provider]  ibuprofen (ADVIL) 200 MG tablet Take 200 mg by mouth every 6 (six) hours as needed for headache, mild pain or moderate pain.    [provider]  metoprolol tartrate (LOPRESSOR) 100 MG tablet Take 1 tablet (100 mg total) by mouth once for 1 dose. Please take one time dose 100mg  metoprolol tartrate 2 hr prior to cardiac CT for HR control IF HR >55bpm. Patient not taking: Reported on 07/08/2021 07/02/20 07/02/20  07/04/20, MD  montelukast (SINGULAIR) 10  MG tablet Take 10 mg by mouth as needed.  10/05/16   [provider]  omeprazole (PRILOSEC) 20 MG capsule Take 20 mg by mouth as needed. 05/12/20   [provider]  PROAIR HFA 108 (90 Base) MCG/ACT inhaler Inhale 2 puffs into the lungs daily as needed for shortness of breath. 07/25/16   [provider]  promethazine (PHENERGAN) 25 MG tablet as needed. 04/23/20   [provider]  propranolol (INDERAL) 40 MG tablet Take 40 mg by mouth 2 (two) times daily. 10/11/16   [provider]  SUMAtriptan (IMITREX) 100 MG tablet Take  100 mg by mouth 3 (three) times daily as needed. 05/12/20   [provider]    Family History Family History  Family history unknown: Yes    Social History Social History   Tobacco Use   Smoking status: Never   Smokeless tobacco: Never  Substance Use Topics   Alcohol use: No   Drug use: No     Allergies   Contrast media [iodinated contrast media], Darvon [propoxyphene], Hydrocodone, Morphine and related, Oxycodone, Bactrim [sulfamethoxazole-trimethoprim], Cephalosporins, Codeine, Morphine, Nsaids, Penicillamine, Penicillins, Salicylates, and Penicillins   Review of Systems Review of Systems Per HPI  Physical Exam Triage Vital Signs ED Triage Vitals  Enc Vitals Group     BP 10/26/22 1652 128/84     Pulse Rate 10/26/22 1652 94     Resp 10/26/22 1652 18     Temp 10/26/22 1652 97.9 F (36.6 C)     Temp Source 10/26/22 1652 Oral     SpO2 10/26/22 1652 98 %     Weight --      Height --      Head Circumference --      Peak Flow --      Pain Score 10/26/22 1651 0     Pain Loc --      Pain Edu? --      Excl. in GC? --    No data found.  Updated Vital Signs BP 128/84 (BP Location: Right Arm)   Pulse 94   Temp 97.9 F (36.6 C) (Oral)   Resp 18   SpO2 98%   Visual Acuity Right Eye Distance:   Left Eye Distance:   Bilateral Distance:    Right Eye Near:   Left Eye Near:    Bilateral Near:     Physical Exam Vitals and nursing note reviewed.  Constitutional:      General: She is not in acute distress.    Appearance: Normal appearance. She is not ill-appearing or toxic-appearing.  HENT:     Head: Normocephalic and atraumatic.     Right Ear: Ear canal and external ear normal. A middle ear effusion is present.     Left Ear: Ear canal and external ear normal. A middle ear effusion is present.     Nose: Congestion and rhinorrhea present.     Right Sinus: Frontal sinus tenderness present. No maxillary sinus tenderness.     Left Sinus: Frontal sinus  tenderness present. No maxillary sinus tenderness.     Mouth/Throat:     Mouth: Mucous membranes are moist.     Pharynx: Oropharynx is clear. Posterior oropharyngeal erythema present. No oropharyngeal exudate.     Comments: Cobblestoning of posterior pharynx Eyes:     General: No scleral icterus.    Extraocular Movements: Extraocular movements intact.  Cardiovascular:     Rate and Rhythm: Normal rate and regular rhythm.  Pulmonary:  Effort: Pulmonary effort is normal. No respiratory distress.     Breath sounds: Normal breath sounds. No wheezing, rhonchi or rales.  Abdominal:     General: Abdomen is flat. Bowel sounds are normal. There is no distension.     Palpations: Abdomen is soft.     Tenderness: There is no abdominal tenderness.  Musculoskeletal:     Cervical back: Normal range of motion and neck supple.  Lymphadenopathy:     Cervical: No cervical adenopathy.  Skin:    General: Skin is warm and dry.     Coloration: Skin is not jaundiced or pale.     Findings: No erythema or rash.  Neurological:     Mental Status: She is alert and oriented to person, place, and time.  Psychiatric:        Behavior: Behavior is cooperative.      UC Treatments / Results  Labs (all labs ordered are listed, but only abnormal results are displayed) Labs Reviewed - No data to display  EKG   Radiology No results found.  Procedures Procedures (including critical care time)  Medications Ordered in UC Medications - No data to display  Initial Impression / Assessment and Plan / UC Course  I have reviewed the triage vital signs and the nursing notes.  Pertinent labs & imaging results that were available during my care of the patient were reviewed by me and considered in my medical decision making (see chart for details).   Patient is well-appearing, normotensive, afebrile, not tachycardic, not tachypneic, oxygenating well on room air.    Acute non-recurrent frontal sinusitis Given  allergy to penicillins, will treat with doxycycline twice daily for 7 days Recommended use of Flonase to help with postnasal drainage, bilateral ear effusions Also recommended use of Tessalon Perles to help with dry cough Chest x-ray deferred today given clear lung examination, oxygenating 98% on room air Can continue albuterol breathing treatments at home as needed for wheezing ER precautions discussed Follow-up with primary care provider if no improvement or worsening of symptoms despite treatment  The patient was given the opportunity to ask questions.  All questions answered to their satisfaction.  The patient is in agreement to this plan.    Final Clinical Impressions(s) / UC Diagnoses   Final diagnoses:  Acute non-recurrent frontal sinusitis     Discharge Instructions      I am suspicious you may have a sinus infection.  This can cause the cough and the postnasal drainage.  Please start Flonase to help with the postnasal drainage and Tessalon Perles to help treat the cough.  Please also start the doxycycline to treat the bacteria in your sinuses.  Continue the breathing treatments.   Follow-up with your primary care provider if your symptoms persist or worsen despite treatment.     ED Prescriptions     Medication Sig Dispense Auth. Provider   fluticasone (FLONASE) 50 MCG/ACT nasal spray Place 1 spray into both nostrils daily. 16 g Noemi Chapel A, NP   doxycycline (VIBRAMYCIN) 100 MG capsule Take 1 capsule (100 mg total) by mouth 2 (two) times daily for 7 days. 14 capsule Noemi Chapel A, NP   benzonatate (TESSALON) 100 MG capsule Take 1 capsule (100 mg total) by mouth 3 (three) times daily as needed for cough. Do not take with alcohol or while driving or operating heavy machinery.  May cause drowsiness. 21 capsule Eulogio Bear, NP      PDMP not reviewed this encounter.  Eulogio Bear, NP 10/26/22 1718

## 2022-11-22 ENCOUNTER — Other Ambulatory Visit: Payer: Self-pay | Admitting: Nurse Practitioner

## 2022-11-22 NOTE — Telephone Encounter (Signed)
Requested Prescriptions  Pending Prescriptions Disp Refills   fluticasone (FLONASE) 50 MCG/ACT nasal spray [Pharmacy Med Name: FLUTICASONE PROP 50 MCG SPRAY] 16 mL     Sig: SPRAY 1 SPRAY INTO BOTH NOSTRILS DAILY.     Ear, Nose, and Throat: Nasal Preparations - Corticosteroids Failed - 11/22/2022  1:24 AM      Failed - Valid encounter within last 12 months    Recent Outpatient Visits           6 years ago Left arm pain   [facility], Laurey Arrow, MD

## 2022-12-16 DIAGNOSIS — Z419 Encounter for procedure for purposes other than remedying health state, unspecified: Secondary | ICD-10-CM | POA: Diagnosis not present

## 2024-02-21 ENCOUNTER — Other Ambulatory Visit (HOSPITAL_COMMUNITY): Payer: Self-pay | Admitting: Family Medicine

## 2024-02-21 DIAGNOSIS — Z1159 Encounter for screening for other viral diseases: Secondary | ICD-10-CM | POA: Diagnosis not present

## 2024-02-21 DIAGNOSIS — Z13220 Encounter for screening for lipoid disorders: Secondary | ICD-10-CM | POA: Diagnosis not present

## 2024-02-21 DIAGNOSIS — Z79899 Other long term (current) drug therapy: Secondary | ICD-10-CM | POA: Diagnosis not present

## 2024-02-21 DIAGNOSIS — E2839 Other primary ovarian failure: Secondary | ICD-10-CM | POA: Diagnosis not present

## 2024-02-21 DIAGNOSIS — Z1331 Encounter for screening for depression: Secondary | ICD-10-CM | POA: Diagnosis not present

## 2024-02-21 DIAGNOSIS — Z124 Encounter for screening for malignant neoplasm of cervix: Secondary | ICD-10-CM | POA: Diagnosis not present

## 2024-02-21 DIAGNOSIS — R7301 Impaired fasting glucose: Secondary | ICD-10-CM | POA: Diagnosis not present

## 2024-02-21 DIAGNOSIS — J302 Other seasonal allergic rhinitis: Secondary | ICD-10-CM | POA: Diagnosis not present

## 2024-02-21 DIAGNOSIS — Z136 Encounter for screening for cardiovascular disorders: Secondary | ICD-10-CM | POA: Diagnosis not present

## 2024-02-21 DIAGNOSIS — I700 Atherosclerosis of aorta: Secondary | ICD-10-CM | POA: Diagnosis not present

## 2024-02-21 DIAGNOSIS — J4520 Mild intermittent asthma, uncomplicated: Secondary | ICD-10-CM | POA: Diagnosis not present

## 2024-02-21 DIAGNOSIS — G43009 Migraine without aura, not intractable, without status migrainosus: Secondary | ICD-10-CM | POA: Diagnosis not present

## 2024-02-21 DIAGNOSIS — Z0000 Encounter for general adult medical examination without abnormal findings: Secondary | ICD-10-CM | POA: Diagnosis not present

## 2024-02-27 ENCOUNTER — Ambulatory Visit (HOSPITAL_COMMUNITY)
Admission: RE | Admit: 2024-02-27 | Discharge: 2024-02-27 | Disposition: A | Discharge status: Home / Self Care | Source: Ambulatory Visit | Attending: Family Medicine | Admitting: Family Medicine

## 2024-02-27 DIAGNOSIS — E2839 Other primary ovarian failure: Secondary | ICD-10-CM | POA: Insufficient documentation

## 2024-02-27 DIAGNOSIS — M8589 Other specified disorders of bone density and structure, multiple sites: Secondary | ICD-10-CM | POA: Diagnosis not present

## 2024-02-27 DIAGNOSIS — Z780 Asymptomatic menopausal state: Secondary | ICD-10-CM | POA: Diagnosis not present

## 2024-07-05 DIAGNOSIS — J4520 Mild intermittent asthma, uncomplicated: Secondary | ICD-10-CM | POA: Diagnosis not present

## 2024-07-05 DIAGNOSIS — R079 Chest pain, unspecified: Secondary | ICD-10-CM | POA: Diagnosis not present

## 2024-07-05 DIAGNOSIS — G250 Essential tremor: Secondary | ICD-10-CM | POA: Diagnosis not present

## 2024-07-05 DIAGNOSIS — R002 Palpitations: Secondary | ICD-10-CM | POA: Diagnosis not present

## 2024-07-05 DIAGNOSIS — G43009 Migraine without aura, not intractable, without status migrainosus: Secondary | ICD-10-CM | POA: Diagnosis not present

## 2024-07-12 ENCOUNTER — Other Ambulatory Visit: Payer: Self-pay | Admitting: Internal Medicine

## 2024-07-12 ENCOUNTER — Ambulatory Visit: Attending: Internal Medicine

## 2024-07-12 ENCOUNTER — Telehealth: Payer: Self-pay | Admitting: Internal Medicine

## 2024-07-12 ENCOUNTER — Ambulatory Visit: Attending: Internal Medicine | Admitting: Internal Medicine

## 2024-07-12 VITALS — BP 120/72 | HR 63 | Ht 66.0 in | Wt 157.2 lb

## 2024-07-12 DIAGNOSIS — R0609 Other forms of dyspnea: Secondary | ICD-10-CM

## 2024-07-12 DIAGNOSIS — R000 Tachycardia, unspecified: Secondary | ICD-10-CM

## 2024-07-12 DIAGNOSIS — R079 Chest pain, unspecified: Secondary | ICD-10-CM | POA: Diagnosis not present

## 2024-07-12 MED ORDER — DILTIAZEM HCL 60 MG PO TABS: 60.0000 mg | ORAL_TABLET | Freq: Three times a day (TID) | ORAL | 5 refills | Status: AC

## 2024-07-12 NOTE — Progress Notes (Signed)
Updated medical and family hx

## 2024-07-12 NOTE — Telephone Encounter (Signed)
 Checking percert on the following   Procedure: LONG TERM MONITOR XT (3-14 DAYS)

## 2024-07-12 NOTE — Patient Instructions (Addendum)
 Medication Instructions:  Your physician has recommended you make the following change in your medication:  Stop taking Aspirin  Start taking Diltiazem  60 mg every 8 hours Continue taking all other medications as prescribed  Labwork: None  Testing/Procedures: Your physician has requested that you have an echocardiogram. Echocardiography is a painless test that uses sound waves to create images of your heart. It provides your doctor with information about the size and shape of your heart and how well your heart's chambers and valves are working. This procedure takes approximately one hour. There are no restrictions for this procedure. Please do NOT wear cologne, perfume, aftershave, or lotions (deodorant is allowed). Please arrive 15 minutes prior to your appointment time.  Please note: We ask at that you not bring children with you during ultrasound (echo/ vascular) testing. Due to room size and safety concerns, children are not allowed in the ultrasound rooms during exams. Our front office staff cannot provide observation of children in our lobby area while testing is being conducted. An adult accompanying a patient to their appointment will only be allowed in the ultrasound room at the discretion of the ultrasound technician under special circumstances. We apologize for any inconvenience.  Your physician has recommended that you wear a Zio monitor.   This monitor is a medical device that records the heart's electrical activity. Doctors most often use these monitors to diagnose arrhythmias. Arrhythmias are problems with the speed or rhythm of the heartbeat. The monitor is a small device applied to your chest. You can wear one while you do your normal daily activities. While wearing this monitor if you have any symptoms to push the button and record what you felt. Once you have worn this monitor for the period of time provider prescribed (for 14 days), you will return the monitor device in the  postage paid box. Once it is returned they will download the data collected and provide us  with a report which the provider will then review and we will call you with those results. Important tips:  Avoid showering during the first 24 hours of wearing the monitor. Avoid excessive sweating to help maximize wear time. Do not submerge the device, no hot tubs, and no swimming pools. Keep any lotions or oils away from the patch. After 24 hours you may shower with the patch on. Take brief showers with your back facing the shower head.  Do not remove patch once it has been placed because that will interrupt data and decrease adhesive wear time. Push the button when you have any symptoms and write down what you were feeling. Once you have completed wearing your monitor, remove and place into box which has postage paid and place in your outgoing mailbox.  If for some reason you have misplaced your box then call our office and we can provide another box and/or mail it off for you.   Follow-Up: Your physician recommends that you schedule a follow-up appointment in: 3 months  Any Other Special Instructions Will Be Listed Below (If Applicable). Thank you for choosing [facility]!     If you need a refill on your cardiac medications before your next appointment, please call your pharmacy.

## 2024-07-12 NOTE — Progress Notes (Signed)
 Cardiology Office Note  Date: 07/12/2024   ID: Savannah Trevino, DOB May 20, 1961, MRN [id number]  PCP:  Rolinda Millman, MD  Cardiologist:  Diannah SHAUNNA Maywood, MD Electrophysiologist:  None   History of Present Illness: Savannah Trevino is a 63 y.o. female  Referred to cardiology clinic for evaluation of chest pain.  Accompanied by daughter.  Previously seen by Dr. Alvan in 2021 for evaluation of chest pain.  Echo was unremarkable.  CT cardiac showed coronary calcium  score of 0 and no evidence of CAD.  She was having chest pains for a long time, initially less frequent but now occurring more frequently.  She also has been going through a lot of stress now.  She has a death in the family, taking care of her 2 homes etc.  She has sharp/stabbing chest pains lasting for about 1 to 2 minutes, occurs 1-2 times per day and almost daily.  She also has shortness of breath with exertion.  Worsening recently.  She showed me her phone that tracks her heart rates.  Due to chest pain episodes, her heart rate shoots up to 130 or 140 BPM.  She denies having palpitations at this time.  No syncope.  No leg swelling.  Past Medical History:  Diagnosis Date   Migraines     Past Surgical History:  Procedure Laterality Date   CHOLECYSTECTOMY     DILATION AND CURETTAGE OF UTERUS     HAND SURGERY Right    KNEE ARTHROSCOPY Left    TUBAL LIGATION      Current Outpatient Medications  Medication Sig Dispense Refill   aspirin  EC 81 MG tablet Take 1 tablet (81 mg total) by mouth daily. 30 tablet 1   calcium  carbonate (SUPER CALCIUM ) 1500 (600 Ca) MG TABS tablet Take by mouth daily.     Cholecalciferol 50 MCG (2000 UT) CAPS Take 1 capsule by mouth daily.     cyclobenzaprine (FLEXERIL) 10 MG tablet Take 10 mg by mouth at bedtime as needed.     fluticasone  (FLONASE ) 50 MCG/ACT nasal spray Place 1 spray into both nostrils daily. 16 g 0   ibuprofen (ADVIL) 200 MG tablet Take 200 mg by mouth every 6 (six) hours  as needed for headache, mild pain or moderate pain.     montelukast (SINGULAIR) 10 MG tablet Take 10 mg by mouth as needed.   1   omeprazole (PRILOSEC) 20 MG capsule Take 20 mg by mouth as needed.     PROAIR  HFA 108 (90 Base) MCG/ACT inhaler Inhale 2 puffs into the lungs daily as needed for shortness of breath.  4   promethazine (PHENERGAN) 25 MG tablet as needed.     propranolol  (INDERAL ) 40 MG tablet Take 40 mg by mouth 2 (two) times daily.  1   SUMAtriptan (IMITREX) 100 MG tablet Take 100 mg by mouth 3 (three) times daily as needed.     No current facility-administered medications for this visit.   Allergies:  Contrast media [iodinated contrast media], Darvon [propoxyphene], Hydrocodone, Morphine and codeine, Oxycodone, Bactrim [sulfamethoxazole-trimethoprim], Cephalosporins, Codeine, Morphine, Nsaids, Penicillamine, Penicillins, Salicylates, and Penicillins   Social History: The patient  reports that she has never smoked. She has never used smokeless tobacco. She reports that she does not drink alcohol and does not use drugs.   Family History: The patient's family history includes Breast cancer in her maternal grandmother; CVA in her brother and father; Colon cancer in her father and paternal grandfather; Diabetes in her brother, father,  and sister; Heart failure in her brother; Hyperlipidemia in her father; Hypertension in her brother, father, mother, and sister.   ROS:  Please see the history of present illness. Otherwise, complete review of systems is positive for none  All other systems are reviewed and negative.   Physical Exam: VS:  BP 120/72   Pulse 63   Ht 5' 6 (1.676 m)   Wt 157 lb 3.2 oz (71.3 kg)   SpO2 98%   BMI 25.37 kg/m , BMI Body mass index is 25.37 kg/m.  Wt Readings from Last 3 Encounters:  07/12/24 157 lb 3.2 oz (71.3 kg)  07/08/21 155 lb (70.3 kg)  05/25/20 158 lb (71.7 kg)    General: Patient appears comfortable at rest. HEENT: Conjunctiva and lids normal,  oropharynx clear with moist mucosa. Neck: Supple, no elevated JVP or carotid bruits, no thyromegaly. Lungs: Clear to auscultation, nonlabored breathing at rest. Cardiac: Regular rate and rhythm, no S3 or significant systolic murmur, no pericardial rub. Abdomen: Soft, nontender, no hepatomegaly, bowel sounds present, no guarding or rebound. Extremities: No pitting edema, distal pulses 2+. Skin: Warm and dry. Musculoskeletal: No kyphosis. Neuropsychiatric: Alert and oriented x3, affect grossly appropriate.  Recent Labwork: No results found for requested labs within last 365 days.     Component Value Date/Time   CHOL 205 (H) 10/16/2016 0434   TRIG 69 10/16/2016 0434   HDL 64 10/16/2016 0434   CHOLHDL 3.2 10/16/2016 0434   VLDL 14 10/16/2016 0434   LDLCALC 127 (H) 10/16/2016 0434    Assessment and Plan:  Chest pain - CT cardiac in 2021 showed coronary calcium  score of 0.  No evidence of CAD.  She reported having sharp/stabbing chest pains lasting for about 1 to 2 minutes, occurring daily, 1-2 times per day.  No relation to rest or exertion.  Likely secondary to stress as she is going through her death in the family, taking care of 2 homes etc.  Chest pain symptoms does not sound like coronary microvascular disease.  But will offer a trial of diltiazem  60 mg every 8 hours to rule in/out coronary microvascular disease. - Discontinue aspirin . - Currently on triptan that can cause chest pains but she has not taken this medication for the last 2 to 3 years.  DOE - Worsening DOE recently.  No orthopnea, PND, leg swelling.  Not getting enough sleep at night.  She is worrying.  Prior echo from 2021 was unremarkable.  Repeat echocardiogram.  Tachycardia - Patient showed her smart phone that showed elevated heart rates corresponding to her chest pain episodes.  Unclear if this is arrhythmia induced chest pain versus chest pain induced physiological sinus tachycardia.  Will obtain 2-week event  monitor.   45 minutes spent in remainder prior records, more than 3 labs, discussion of the above problems with the patient, documentation and answering all her questions.  Medication Adjustments/Labs and Tests Ordered: Current medicines are reviewed at length with the patient today.  Concerns regarding medicines are outlined above.    Disposition:  Follow up 3 months  Signed Mccayla Shimada Priya Eliezer Khawaja, MD, 07/12/2024 11:07 AM    [facility] [address] [place], [place], [place] [address]

## 2024-07-30 DIAGNOSIS — R000 Tachycardia, unspecified: Secondary | ICD-10-CM | POA: Diagnosis not present

## 2024-07-31 ENCOUNTER — Ambulatory Visit: Attending: Internal Medicine

## 2024-07-31 DIAGNOSIS — R000 Tachycardia, unspecified: Secondary | ICD-10-CM | POA: Diagnosis not present

## 2024-07-31 LAB — ECHOCARDIOGRAM COMPLETE
AR max vel: 2.14 cm2
AV Peak grad: 8.3 mmHg
Ao pk vel: 1.44 m/s
Area-P 1/2: 4.93 cm2
Calc EF: 60 %
S' Lateral: 2.8 cm
Single Plane A2C EF: 55.6 %
Single Plane A4C EF: 67.3 %

## 2024-08-05 ENCOUNTER — Telehealth: Payer: Self-pay | Admitting: Internal Medicine

## 2024-08-05 NOTE — Telephone Encounter (Signed)
Pt calling to f/u on test results. Please advise. 

## 2024-08-06 ENCOUNTER — Encounter: Payer: Self-pay | Admitting: *Deleted

## 2024-08-06 NOTE — Telephone Encounter (Signed)
 Notified via mychart.

## 2024-08-09 ENCOUNTER — Ambulatory Visit: Payer: Self-pay | Admitting: Internal Medicine

## 2024-08-12 DIAGNOSIS — R000 Tachycardia, unspecified: Secondary | ICD-10-CM | POA: Diagnosis not present

## 2024-08-30 ENCOUNTER — Ambulatory Visit: Payer: Self-pay | Admitting: Internal Medicine

## 2024-09-03 ENCOUNTER — Encounter: Payer: Self-pay | Admitting: Internal Medicine

## 2024-10-04 ENCOUNTER — Ambulatory Visit: Attending: Internal Medicine | Admitting: Internal Medicine

## 2024-10-04 ENCOUNTER — Encounter: Payer: Self-pay | Admitting: Nurse Practitioner

## 2024-10-23 ENCOUNTER — Ambulatory Visit: Admitting: Cardiology
